# Patient Record
Sex: Male | Born: 1985 | Race: White | Hispanic: No | Marital: Single | Smoking: Current every day smoker
Health system: Southern US, Community
[De-identification: ages and names within clinical notes are randomized; demographics above are authoritative.]

## PROBLEM LIST (undated history)

## (undated) DIAGNOSIS — Z789 Other specified health status: Secondary | ICD-10-CM

## (undated) DIAGNOSIS — B1920 Unspecified viral hepatitis C without hepatic coma: Secondary | ICD-10-CM

---
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
## 2006-03-20 ENCOUNTER — Emergency Department (HOSPITAL_COMMUNITY): Admission: EM | Admit: 2006-03-20 | Discharge: 2006-03-20 | Payer: Self-pay | Admitting: Emergency Medicine

## 2008-02-09 ENCOUNTER — Encounter: Payer: Self-pay | Admitting: Orthopedic Surgery

## 2008-02-10 ENCOUNTER — Observation Stay (HOSPITAL_COMMUNITY): Admission: EM | Admit: 2008-02-10 | Discharge: 2008-02-11 | Payer: Self-pay | Admitting: Emergency Medicine

## 2008-02-10 ENCOUNTER — Ambulatory Visit: Payer: Self-pay | Admitting: Orthopedic Surgery

## 2008-02-12 ENCOUNTER — Emergency Department (HOSPITAL_COMMUNITY): Admission: EM | Admit: 2008-02-12 | Discharge: 2008-02-13 | Payer: Self-pay | Admitting: Emergency Medicine

## 2008-02-13 ENCOUNTER — Ambulatory Visit (HOSPITAL_COMMUNITY): Admission: RE | Admit: 2008-02-13 | Discharge: 2008-02-13 | Payer: Self-pay | Admitting: Emergency Medicine

## 2008-02-21 ENCOUNTER — Ambulatory Visit: Payer: Self-pay | Admitting: Orthopedic Surgery

## 2008-02-21 DIAGNOSIS — L02419 Cutaneous abscess of limb, unspecified: Secondary | ICD-10-CM | POA: Insufficient documentation

## 2008-02-21 DIAGNOSIS — L03119 Cellulitis of unspecified part of limb: Secondary | ICD-10-CM

## 2008-03-06 ENCOUNTER — Ambulatory Visit: Payer: Self-pay | Admitting: Orthopedic Surgery

## 2008-06-25 ENCOUNTER — Emergency Department (HOSPITAL_COMMUNITY): Admission: EM | Admit: 2008-06-25 | Discharge: 2008-06-25 | Payer: Self-pay | Admitting: Emergency Medicine

## 2009-04-27 ENCOUNTER — Emergency Department (HOSPITAL_COMMUNITY): Admission: EM | Admit: 2009-04-27 | Discharge: 2009-04-27 | Payer: Self-pay | Admitting: Emergency Medicine

## 2009-11-03 ENCOUNTER — Emergency Department: Payer: Self-pay | Admitting: Emergency Medicine
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
## 2010-09-24 NOTE — Group Therapy Note (Signed)
Ricky Fleming, Ricky Fleming               ACCOUNT NO.:  [id number]   MEDICAL RECORD NO.:  [id number]          PATIENT TYPE:  OBV   LOCATION:  A335                          FACILITY:  [facility]   PHYSICIAN:  Vickki Hearing, M.D.DATE OF BIRTH:  Oct 28, 1985   DATE OF PROCEDURE:  DATE OF DISCHARGE:  02/11/2008                                 PROGRESS NOTE   The patient was re-evaluated after 24 hours of antibiotics and found to  have decrease in cellulitis, swelling, tenderness, and redness.      Vickki Hearing, M.D.  Electronically Signed     SEH/MEDQ  D:  02/22/2008  T:  02/22/2008  Job:  [id number]

## 2010-09-24 NOTE — Discharge Summary (Signed)
NAMEJERMINE, Ricky Fleming               ACCOUNT NO.:  [id number]   MEDICAL RECORD NO.:  [id number]          PATIENT TYPE:  INP   LOCATION:  A335                          FACILITY:  [facility]   PHYSICIAN:  Skeet Latch, DO    DATE OF BIRTH:  1986-01-10   DATE OF ADMISSION:  02/09/2008  DATE OF DISCHARGE:  10/02/2009LH                               DISCHARGE SUMMARY   DISCHARGE DIAGNOSES:  1. Left-knee cellulitis.  2. Hypokalemia.   BRIEF HOSPITAL COURSE:  This is a 25 year old Caucasian male who  presented to [facility], complaining of left-knee pain.  Patient states that he noticed a pimple on his left knee, it ruptured  and at work he noticed he had extreme pain in the knee.  He saw it  draining, as well as draining with pus and blood.  At that time, his  knee was erythematous.  Patient decided to come to the emergency room to  be evaluated.   Patient's initial workup included a left-knee x-ray that showed no acute  osseous abnormalities, a little, if any, knee effusion.  Patient  underwent an MRI of his left knee that showed findings compatible with  cellulitis anteriorly in the patient proximal to middle of the leg and  proximal extension anterior to the patellar tendon.  There was no focal  fluid collection demonstrated to suggest the presence of an abscess.  There is proximal mild proximal extensor myositis.  No demonstrated  intra-articular abnormalities, aside from a small Baker cyst.  No  evidence of osteomyelitis.   Patient was placed on IV antibiotics.  He was continued on IV  antibiotics during his hospital stay.  Patient's pain seemed to be  moderate to severe when palpated during his hospital stay.  Patient  seemed stable during his stay.  Orthopedic surgery was consulted  regarding any surgical intervention.  No surgical intervention was  recommended.  It was just recommended that patient be continued on  antibiotics.  At this time, patient will be discharged  with close  followup with orthopedic surgery in approximately one week.   DISCHARGE MEDICATIONS INCLUDED:  1. Oxycodone 10 mg every 4 hours as needed.  2. Medrol Dosepak as directed.  3. Doxycycline 100 mg twice a day for 14 days.   VITALS ON DISCHARGE:  Temperature is 99.8, pulse 103, respirations 20,  blood pressure 105/70.   DISCHARGE LABORATORY VALUES:  Sodium is 137, potassium 3.3, chloride  104, CO2 is 25, glucose 94, BUN 4, creatinine 0.66.  White count is  10.6, hemoglobin 13.4, hematocrit 38.7, platelet count is 197.  He had  one blood culture that showed Gram-negative rods.  This is preliminary.  There is no final blood culture result at this time.   CONDITION ON DISCHARGE:  Stable.   DISPOSITION:  Patient will be discharged to home.   DISCHARGE INSTRUCTIONS:  Patient is not to go back to work for  approximately one week until seen by orthopedic surgery.  Patient's diet  is with no restrictions.  Patient is to keep his left leg clean, dry.  Patient is to increase  his activity slowly.  Patient is to take all  medications as directed.  Patient is to apply a heating pad to his left  knee three times daily until symptoms have resolved.  Patient also can  take Tylenol every 4 hours as needed for pain or fever.  Patient is to  follow up with Dr. Romeo Apple in approximately one week.  His office  number and address will be given to the patient prior to discharge.  Patient is to return to the emergency room if he has any more severe  left-knee pain or increased swelling.  I believe patient understands all  these instructions at this time.      Skeet Latch, DO  Electronically Signed     SM/MEDQ  D:  02/11/2008  T:  02/11/2008  Job:  [id number]   cc:   Vickki Hearing, M.D.  Fax: [phone]

## 2010-09-24 NOTE — H&P (Signed)
Ricky Fleming, Ricky Fleming               ACCOUNT NO.:  [id number]   MEDICAL RECORD NO.:  [id number]          PATIENT TYPE:  OBV   LOCATION:  A335                          FACILITY:  [facility]   PHYSICIAN:  Dorris Singh, DO    DATE OF BIRTH:  07-30-1985   DATE OF ADMISSION:  02/09/2008  DATE OF DISCHARGE:  LH                              HISTORY & PHYSICAL   HISTORY OF PRESENT ILLNESS:  The patient is a 25 year old Caucasian male  who presented to [facility] with left knee pain.  The patient states  that he had noticed a pimple on his left knee and he ruptured it, and  while he was at work he noticed that he got extreme pain in his knee,  looked down and saw pus and blood oozing out of his pants.  At that  point in time, he looked at his knee and it was red.  The patient denies  any fever or chills, but is positive only for pain.   PAST MEDICAL HISTORY:  He he has none.   ALLERGIES:  He has no known allergies to any medication.   SOCIAL HISTORY:  The patient is a smoker and occasional drinker.  Does  admit to a former drug abuse.   MEDICATIONS:  None.   REVIEW OF SYSTEMS:  CONSTITUTIONAL:  Negative for weakness or appetite  changes.  HEART:  Negative for palpitations or chest pain.  RESPIRATORY:  Negative for shortness of breath.  ABDOMEN:  Negative for nausea,  vomiting, fever, chills.  GU:  Negative for dysuria or hesitancy.  MUSCULOSKELETAL:  Positive for knee pain.  NEUROLOGICAL:  Negative for  dizziness or changes of consciousness or altered mental status.  SKIN:  Positive for redness and swelling of left knee.   PHYSICAL EXAMINATION:  VITAL SIGNS:  Stable.  GENERAL:  The patient is a 25 year old Caucasian male who is well-  developed, well-nourished in no acute distress.  HEART:  Regular rate and rhythm.  LUNGS:  Clear to auscultation bilaterally.  ABDOMEN:  Soft, nontender, nondistended.  EXTREMITIES:  Left knee positive redness and swelling with a rupture to  the I&D site  from the patient rupturing it the night before.  Positive  pulses.   LABORATORY DATA:  His labs for today are as follows:  White count of  14.9, hemoglobin 14.4, hematocrit 41, platelet count 246.  Blood  cultures currently are negative.  He had a radiology exam done for  complete view of the knee which showed no acute osseous abnormalities,  little if any knee effusion.   ASSESSMENT/PLAN:  1. Left knee cellulitis.  2. Tobacco abuse.   PLAN:  Admit the patient to the service of [facility] to [facility].  Will  have them make him total bedrest with elevation of the knee with cold  compresses p.r.n., give him a regular diet, give him some IV fluids as  well.  Check his labs in the morning.  CBC, B-met.  Put him on DVT and  GI prophylaxis along with antiemetics and also give him some pain  medication.  Will put  on vancomycin with pharmacy to dose and contact  precautions will also educate him on smoking cessation.  Will determine  how he develops over the next 24 hours to see if we may need a PICC line  placement for him, and depending, will also consider case management to  see him for possible IV home therapy as needed.  Will continue to  monitor the patient and change therapy when appropriate.      Dorris Singh, DO  Electronically Signed     CB/MEDQ  D:  02/10/2008  T:  02/10/2008  Job:  [id number]

## 2011-02-10 LAB — CBC
HCT: 41
MCHC: 35.1
MCV: 88.6
Platelets: 246
RBC: 4.62
RDW: 13.1
WBC: 14.9 — ABNORMAL HIGH

## 2011-02-10 LAB — DIFFERENTIAL
Basophils Relative: 0
Eosinophils Absolute: 0
Lymphs Abs: 2.6
Neutro Abs: 11.3 — ABNORMAL HIGH

## 2011-02-10 LAB — CULTURE, BLOOD (ROUTINE X 2)
Culture: NO GROWTH
Report Status: 10052009

## 2011-02-11 LAB — DIFFERENTIAL
Basophils Absolute: 0
Basophils Relative: 0
Eosinophils Absolute: 0.1
Eosinophils Absolute: 0.1
Eosinophils Relative: 1
Lymphocytes Relative: 19
Lymphocytes Relative: 22
Monocytes Absolute: 1
Monocytes Absolute: 1.1 — ABNORMAL HIGH
Monocytes Relative: 9
Neutro Abs: 7.5
Neutro Abs: 7.7

## 2011-02-11 LAB — CBC
HCT: 38.7 — ABNORMAL LOW
HCT: 39
Hemoglobin: 13.4
Hemoglobin: 13.6
MCV: 89.4
MCV: 90.5
Platelets: 197
Platelets: 204
RBC: 4.36
WBC: 10.6 — ABNORMAL HIGH
WBC: 11.3 — ABNORMAL HIGH

## 2011-02-11 LAB — BASIC METABOLIC PANEL
BUN: 4 — ABNORMAL LOW
Calcium: 8.9
Chloride: 104
GFR calc Af Amer: >60
Potassium: 3.2 — ABNORMAL LOW
Sodium: 137

## 2011-02-11 LAB — PROTIME-INR: INR: 1.1

## 2011-03-09 ENCOUNTER — Emergency Department (HOSPITAL_COMMUNITY)
Admission: EM | Admit: 2011-03-09 | Discharge: 2011-03-09 | Disposition: A | Discharge status: Home / Self Care | Payer: Self-pay | Attending: Emergency Medicine | Admitting: Emergency Medicine

## 2011-03-09 ENCOUNTER — Emergency Department (HOSPITAL_COMMUNITY): Payer: Self-pay

## 2011-03-09 DIAGNOSIS — S62309A Unspecified fracture of unspecified metacarpal bone, initial encounter for closed fracture: Secondary | ICD-10-CM | POA: Insufficient documentation

## 2011-03-09 DIAGNOSIS — F17200 Nicotine dependence, unspecified, uncomplicated: Secondary | ICD-10-CM | POA: Insufficient documentation

## 2011-03-09 DIAGNOSIS — IMO0002 Reserved for concepts with insufficient information to code with codable children: Secondary | ICD-10-CM | POA: Insufficient documentation

## 2011-03-09 MED ORDER — HYDROCODONE-ACETAMINOPHEN 5-325 MG PO TABS: 1.0000 | ORAL_TABLET | ORAL | Status: AC | PRN

## 2011-03-09 NOTE — ED Provider Notes (Signed)
History     CSN: [id number] Arrival date & time: 03/09/2011  5:22 PM   First MD Initiated Contact with Patient 03/09/11 1718      Chief Complaint  Patient presents with  . Wrist Injury    (Consider location/radiation/quality/duration/timing/severity/associated sxs/prior treatment) Patient is a 25 y.o. male presenting with wrist injury. The history is provided by the patient.  Wrist Injury  The incident occurred yesterday. The incident occurred at home. The injury mechanism was a direct blow (He was hit directly on his right dorsal wrist by a sledge hammer yesterday while working on his tractor). The pain is present in the right wrist. The quality of the pain is described as sharp and throbbing. The pain is at a severity of 4/10. The pain is moderate. The pain has been constant since the incident. Pertinent negatives include no fever. He reports no foreign bodies present. The symptoms are aggravated by movement, palpation and use. He has tried nothing for the symptoms.    History reviewed. No pertinent past medical history.  History reviewed. No pertinent past surgical history.  History reviewed. No pertinent family history.  History  Substance Use Topics  . Smoking status: Current Everyday Smoker -- 2.0 packs/day  . Smokeless tobacco: Not on file  . Alcohol Use: No      Review of Systems  Constitutional: Negative for fever.  HENT: Negative.  Negative for sore throat.   Eyes: Negative.   Respiratory: Negative.   Cardiovascular: Negative.   Gastrointestinal: Negative.   Genitourinary: Negative.   Musculoskeletal: Positive for arthralgias. Negative for joint swelling.  Skin: Negative.  Negative for rash and wound.  Neurological: Negative for weakness and numbness.  Hematological: Negative.   Psychiatric/Behavioral: Negative.     Allergies  Review of patient's allergies indicates no known allergies.  Home Medications  No current outpatient prescriptions on  file.  BP 113/50  Pulse 60  Temp(Src) 97.6 F (36.4 C) (Oral)  Resp 16  Ht 6\' 4"  (1.93 m)  Wt 170 lb (77.111 kg)  BMI 20.69 kg/m2  SpO2 100%  Physical Exam  Nursing note and vitals reviewed. Constitutional: He is oriented to person, place, and time. He appears well-developed and well-nourished.  HENT:  Head: Normocephalic.  Eyes: Conjunctivae are normal.  Neck: Normal range of motion.  Cardiovascular: Normal rate and intact distal pulses.  Exam reveals no decreased pulses.   Pulses:      Dorsalis pedis pulses are 2+ on the right side, and 2+ on the left side.       Posterior tibial pulses are 2+ on the right side, and 2+ on the left side.  Pulmonary/Chest: Effort normal.  Musculoskeletal: He exhibits edema and tenderness.       Right wrist: He exhibits decreased range of motion, tenderness and bony tenderness. He exhibits no swelling and no deformity.       Pain to palpation along base of 2nd through 4th metacarpal bones.  No deformity noted.  Distal sensation intact.  Less than 3 sec cap refill.  Neurological: He is alert and oriented to person, place, and time. No sensory deficit.  Skin: Skin is warm, dry and intact.    ED Course  Procedures (including critical care time)  Labs Reviewed - No data to display Dg Wrist Complete Right  03/09/2011  *RADIOLOGY REPORT*  Clinical Data: Injury  RIGHT WRIST - COMPLETE 3+ VIEW  Comparison: None.  Findings: No fracture or dislocation is seen in the wrist.  Possible irregularity  of the radial base of the fourth metacarpal.  The joint spaces are preserved.  The visualized soft tissues are unremarkable.  IMPRESSION: No fracture or dislocation is seen in the rest.  Possible irregularity/fracture of the radial base of the fourth metacarpal.  Original Report Authenticated By: Charline Bills, M.D.   Dg Hand Complete Right  03/09/2011  *RADIOLOGY REPORT*  Clinical Data: Hip hand with sledgehammer, pain to second through fifth metacarpals   RIGHT HAND - COMPLETE 3+ VIEW  Comparison: 06/25/2008  Findings: On the lateral view, there is a suspected cortical irregularity/fracture involving the base of one of the metacarpals, possibly the 4th.  However, no corresponding abnormality is seen on the additional views.  Possible mild dorsal soft tissue swelling.  IMPRESSION: Possible cortical irregularity/fracture involving the base of one of the metacarpals on the lateral view, possibly the 4th, although this cannot be localized on the additional views.  Correlate with the site of patient's maximal tenderness.  Original Report Authenticated By: Charline Bills, M.D.     No diagnosis found.    MDM  Probable fracture of 4th proximal metacarpal.  Wrist splint,  Sling applied by RN.  Examined post application,  Distal sensation intact,         Candis Musa, [place] 03/09/11 1744
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
## 2011-03-10 NOTE — ED Provider Notes (Signed)
Medical screening examination/treatment/procedure(s) were performed by non-physician practitioner and as supervising physician I was immediately available for consultation/collaboration.    Gwyneth Sprout, MD 03/10/11 0021

## 2012-05-01 ENCOUNTER — Emergency Department (HOSPITAL_COMMUNITY)
Admission: EM | Admit: 2012-05-01 | Discharge: 2012-05-01 | Disposition: A | Discharge status: Home / Self Care | Payer: No Typology Code available for payment source | Attending: Emergency Medicine | Admitting: Emergency Medicine

## 2012-05-01 ENCOUNTER — Emergency Department (HOSPITAL_COMMUNITY): Payer: No Typology Code available for payment source

## 2012-05-01 ENCOUNTER — Encounter (HOSPITAL_COMMUNITY): Payer: Self-pay | Admitting: *Deleted

## 2012-05-01 DIAGNOSIS — R0789 Other chest pain: Secondary | ICD-10-CM

## 2012-05-01 DIAGNOSIS — J069 Acute upper respiratory infection, unspecified: Secondary | ICD-10-CM | POA: Insufficient documentation

## 2012-05-01 DIAGNOSIS — Y939 Activity, unspecified: Secondary | ICD-10-CM | POA: Insufficient documentation

## 2012-05-01 DIAGNOSIS — F17200 Nicotine dependence, unspecified, uncomplicated: Secondary | ICD-10-CM | POA: Insufficient documentation

## 2012-05-01 DIAGNOSIS — Y92410 Unspecified street and highway as the place of occurrence of the external cause: Secondary | ICD-10-CM | POA: Insufficient documentation

## 2012-05-01 DIAGNOSIS — S298XXA Other specified injuries of thorax, initial encounter: Secondary | ICD-10-CM | POA: Insufficient documentation

## 2012-05-01 MED ORDER — IBUPROFEN 800 MG PO TABS
800.0000 mg | ORAL_TABLET | Freq: Once | ORAL | Status: AC
  Administered 2012-05-01: 800 mg via ORAL
  Filled 2012-05-01: qty 1

## 2012-05-01 MED ORDER — BACLOFEN 10 MG PO TABS: 10.0000 mg | ORAL_TABLET | Freq: Three times a day (TID) | ORAL | Status: AC

## 2012-05-01 MED ORDER — DIAZEPAM 5 MG PO TABS
5.0000 mg | ORAL_TABLET | Freq: Once | ORAL | Status: AC
  Administered 2012-05-01: 5 mg via ORAL
  Filled 2012-05-01: qty 1

## 2012-05-01 MED ORDER — PSEUDOEPHEDRINE HCL 60 MG PO TABS
60.0000 mg | ORAL_TABLET | Freq: Once | ORAL | Status: AC
Start: 2012-05-01 — End: 2012-05-01
  Administered 2012-05-01: 60 mg via ORAL
  Filled 2012-05-01: qty 1

## 2012-05-01 MED ORDER — PSEUDOEPHEDRINE HCL 60 MG PO TABS: ORAL_TABLET | ORAL | Status: DC

## 2012-05-01 NOTE — ED Notes (Signed)
Pt escorted to Radiology.
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
## 2012-05-02 NOTE — ED Provider Notes (Signed)
Medical screening examination/treatment/procedure(s) were performed by non-physician practitioner and as supervising physician I was immediately available forconsultation/collaboration.   Jahnya Trindade, MD 05/02/12 1049 

## 2012-11-27 ENCOUNTER — Emergency Department (HOSPITAL_COMMUNITY)
Admission: EM | Admit: 2012-11-27 | Discharge: 2012-11-27 | Disposition: A | Discharge status: Home / Self Care | Payer: Self-pay | Attending: Emergency Medicine | Admitting: Emergency Medicine

## 2012-11-27 ENCOUNTER — Encounter (HOSPITAL_COMMUNITY): Payer: Self-pay | Admitting: *Deleted

## 2012-11-27 ENCOUNTER — Emergency Department (HOSPITAL_COMMUNITY): Payer: Self-pay

## 2012-11-27 DIAGNOSIS — F17200 Nicotine dependence, unspecified, uncomplicated: Secondary | ICD-10-CM | POA: Insufficient documentation

## 2012-11-27 DIAGNOSIS — Y9289 Other specified places as the place of occurrence of the external cause: Secondary | ICD-10-CM | POA: Insufficient documentation

## 2012-11-27 DIAGNOSIS — R209 Unspecified disturbances of skin sensation: Secondary | ICD-10-CM | POA: Insufficient documentation

## 2012-11-27 DIAGNOSIS — S62300A Unspecified fracture of second metacarpal bone, right hand, initial encounter for closed fracture: Secondary | ICD-10-CM

## 2012-11-27 DIAGNOSIS — W208XXA Other cause of strike by thrown, projected or falling object, initial encounter: Secondary | ICD-10-CM | POA: Insufficient documentation

## 2012-11-27 DIAGNOSIS — S62309A Unspecified fracture of unspecified metacarpal bone, initial encounter for closed fracture: Secondary | ICD-10-CM | POA: Insufficient documentation

## 2012-11-27 DIAGNOSIS — Y9389 Activity, other specified: Secondary | ICD-10-CM | POA: Insufficient documentation

## 2012-11-27 MED ORDER — IBUPROFEN 800 MG PO TABS: 800.0000 mg | ORAL_TABLET | Freq: Three times a day (TID) | ORAL | Status: DC

## 2012-11-27 MED ORDER — OXYCODONE-ACETAMINOPHEN 5-325 MG PO TABS
1.0000 | ORAL_TABLET | Freq: Once | ORAL | Status: AC
  Administered 2012-11-27: 1 via ORAL
  Filled 2012-11-27: qty 1

## 2012-11-27 MED ORDER — IBUPROFEN 800 MG PO TABS
800.0000 mg | ORAL_TABLET | Freq: Once | ORAL | Status: AC
  Administered 2012-11-27: 800 mg via ORAL
  Filled 2012-11-27: qty 1

## 2012-11-27 MED ORDER — OXYCODONE-ACETAMINOPHEN 7.5-325 MG PO TABS: 1.0000 | ORAL_TABLET | ORAL | Status: DC | PRN

## 2012-11-27 NOTE — ED Notes (Signed)
Pt was working today when a heavy piece of equipment ?400lbs fell on right hand area, pt has swelling, bruising to right hand, happened this am,

## 2012-11-27 NOTE — ED Provider Notes (Signed)
History    CSN: [id number] Arrival date & time 11/27/12  1442  First MD Initiated Contact with Patient 11/27/12 1548     Chief Complaint  Patient presents with  . Hand Injury   (Consider location/radiation/quality/duration/timing/severity/associated sxs/prior Treatment) Patient is a 27 y.o. male presenting with hand injury. The history is provided by the patient.  Hand Injury Location:  Hand Time since incident:  6 hours Injury: yes   Mechanism of injury: crush   Crush injury:    Mechanism:  Farm machinery   Approximate weight of object:  400 pounds Hand location:  R hand Pain details:    Quality:  Pressure, shooting, tingling and throbbing   Radiates to:  Does not radiate   Severity:  Severe   Onset quality:  Sudden   Duration:  6 hours   Timing:  Constant   Progression:  Worsening Chronicity:  New Handedness:  Right-handed Dislocation: no   Foreign body present:  No foreign bodies Prior injury to area:  No Relieved by:  Nothing Worsened by:  Movement Ineffective treatments:  NSAIDs Associated symptoms: no fever and no neck pain    Ricky Fleming is a 27 y.o. male who presents to the ED with a crush injury to the right hand. He states that he was working on a tractor and a bar fell on his hand. The injury happened this morning and he has been resting the area, using ice and taking ibuprofen without relief. The hand is very painful and swollen.   History reviewed. No pertinent past medical history. History reviewed. No pertinent past surgical history. No family history on file. History  Substance Use Topics  . Smoking status: Current Every Day Smoker -- 2.00 packs/day    Types: Cigarettes  . Smokeless tobacco: Not on file  . Alcohol Use: No    Review of Systems  Constitutional: Negative for fever and chills.  HENT: Negative for neck pain.   Respiratory: Negative for shortness of breath.   Gastrointestinal: Negative for nausea and vomiting.    Musculoskeletal:       Right hand pain and swelling   Skin: Negative for wound.  Neurological: Negative for headaches.  Psychiatric/Behavioral: The patient is not nervous/anxious.     Allergies  Review of patient's allergies indicates no known allergies.  Home Medications   Current Outpatient Rx  Name  Route  Sig  Dispense  Refill  . ibuprofen (ADVIL,MOTRIN) 200 MG tablet   Oral   Take 400 mg by mouth every 6 (six) hours as needed. For pain         . pseudoephedrine (SUDAFED) 60 MG tablet      1 po tid for congestion   30 tablet   0    BP 100/57  Pulse 76  Temp(Src) 98 F (36.7 C) (Oral)  Resp 20  Ht 6\' 4"  (1.93 m)  Wt 170 lb (77.111 kg)  BMI 20.7 kg/m2  SpO2 99% Physical Exam  Nursing note and vitals reviewed. Constitutional: He is oriented to person, place, and time. He appears well-developed and well-nourished. No distress.  HENT:  Head: Normocephalic.  Eyes: EOM are normal.  Neck: Normal range of motion. Neck supple.  Cardiovascular: Normal rate.   Pulmonary/Chest: Effort normal.  Musculoskeletal:       Right hand: He exhibits decreased range of motion, tenderness, bony tenderness, deformity and swelling. He exhibits normal capillary refill and no laceration. Decreased sensation (minimal) noted. Decreased sensation is present in the radial  distribution. Decreased strength (due to pain) noted.       Hands: Pain, swelling, ecchymosis of dorsal aspect of the right hand over the second distal MC.   Neurological: He is alert and oriented to person, place, and time. No cranial nerve deficit.  Adequate circulation  Skin: Skin is warm and dry.  Psychiatric: He has a normal mood and affect. His behavior is normal.    ED Course  Procedures (including critical care time) Labs Reviewed - No data to display Dg Hand Complete Right  11/27/2012   *RADIOLOGY REPORT*  Clinical Data: Status post crush injury.  Right hand pain.  RIGHT HAND - COMPLETE 3+ VIEW  Comparison:  Plain films right hand 03/09/2011.  Findings: The patient has a fracture of the distal second metacarpal.  The metacarpal head is divided into two fragments. The fracture has an oblique component originating in the ulnar side metaphysis and extending through the radial aspect of the metacarpal head.  There is also a transverse component through the distal neck.  The fracture is mildly impacted.  No other acute bony or joint abnormality is identified.  IMPRESSION: Distal second metacarpal fracture as described.   Original Report Authenticated By: Holley Dexter, M.D.   MDM: Dr. Juleen China in to examine the patient.   27 y.o. male with a closed fracture of the second MC right hand. The fracture is through the radial aspect of the [facility] head and a transverse component through the distal neck.  Will place in radial gutter splint, ice, elevation and follow up with ortho. Discussed in detail with the patient x-ray and clinical findings and plan of care. Discussed signs and symptoms of compartment syndrome and if any appear need for immediate return to the ED. Patient voices understanding.  I have reviewed this patient's vital signs, nurses notes, appropriate imaging.  Patient remains neurovascular intact s/p splint application and is stable for discharge home.    Medication List    TAKE these medications       oxyCODONE-acetaminophen 7.5-325 MG per tablet  Commonly known as:  PERCOCET  Take 1 tablet by mouth every 4 (four) hours as needed for pain.      ASK your doctor about these medications       ibuprofen 200 MG tablet  Commonly known as:  ADVIL,MOTRIN  Take 400 mg by mouth every 6 (six) hours as needed. For pain  Ask about: Which instructions should I use?     ibuprofen 800 MG tablet  Commonly known as:  ADVIL,MOTRIN  Take 1 tablet (800 mg total) by mouth 3 (three) times daily.  Ask about: Which instructions should I use?         [place], [place] 11/27/12 [phone]

## 2012-12-02 ENCOUNTER — Emergency Department (HOSPITAL_COMMUNITY): Payer: Self-pay

## 2012-12-02 ENCOUNTER — Ambulatory Visit: Payer: Self-pay | Admitting: Orthopedic Surgery

## 2012-12-02 ENCOUNTER — Encounter (HOSPITAL_COMMUNITY): Payer: Self-pay | Admitting: *Deleted

## 2012-12-02 ENCOUNTER — Emergency Department (HOSPITAL_COMMUNITY)
Admission: EM | Admit: 2012-12-02 | Discharge: 2012-12-02 | Disposition: A | Discharge status: Home / Self Care | Payer: Self-pay | Attending: Emergency Medicine | Admitting: Emergency Medicine

## 2012-12-02 DIAGNOSIS — Y929 Unspecified place or not applicable: Secondary | ICD-10-CM | POA: Insufficient documentation

## 2012-12-02 DIAGNOSIS — S62309A Unspecified fracture of unspecified metacarpal bone, initial encounter for closed fracture: Secondary | ICD-10-CM | POA: Insufficient documentation

## 2012-12-02 DIAGNOSIS — W19XXXA Unspecified fall, initial encounter: Secondary | ICD-10-CM | POA: Insufficient documentation

## 2012-12-02 DIAGNOSIS — S62309D Unspecified fracture of unspecified metacarpal bone, subsequent encounter for fracture with routine healing: Secondary | ICD-10-CM

## 2012-12-02 DIAGNOSIS — Y939 Activity, unspecified: Secondary | ICD-10-CM | POA: Insufficient documentation

## 2012-12-02 DIAGNOSIS — F17200 Nicotine dependence, unspecified, uncomplicated: Secondary | ICD-10-CM | POA: Insufficient documentation

## 2012-12-02 MED ORDER — OXYCODONE-ACETAMINOPHEN 5-325 MG PO TABS
2.0000 | ORAL_TABLET | Freq: Once | ORAL | Status: AC
  Administered 2012-12-02: 2 via ORAL
  Filled 2012-12-02: qty 2

## 2012-12-02 NOTE — ED Notes (Signed)
Injury to right arm x 5 days ago.  Seen here and given temp splint.  States fell 3 days ago, landing on same arm.  Unsure if re-injured or not.  C/o pain.  Unable to see Ortho due to financial reasons.

## 2012-12-02 NOTE — ED Provider Notes (Signed)
Medical screening examination/treatment/procedure(s) were conducted as a shared visit with non-physician practitioner(s) and myself.  I personally evaluated the patient during the encounter.  26yM with crush injury to hand. Closed fx as below.  Diffuse swelling. Not tense, able to range digits albeit with significant pain. Sensation intact to light touch. Brisk cap refill in finger tips. Doubt compartment syndrome. Splint. Pain meds. Hand FU.    Dg Hand Complete Right  11/27/2012   *RADIOLOGY REPORT*  Clinical Data: Status post crush injury.  Right hand pain.  RIGHT HAND - COMPLETE 3+ VIEW  Comparison: Plain films right hand 03/09/2011.  Findings: The patient has a fracture of the distal second metacarpal.  The metacarpal head is divided into two fragments. The fracture has an oblique component originating in the ulnar side metaphysis and extending through the radial aspect of the metacarpal head.  There is also a transverse component through the distal neck.  The fracture is mildly impacted.  No other acute bony or joint abnormality is identified.  IMPRESSION: Distal second metacarpal fracture as described.   Original Report Authenticated By: Holley Dexter, M.D.   Raeford Razor, MD 12/02/12 2234

## 2012-12-02 NOTE — ED Provider Notes (Signed)
CSN: [id number]     Arrival date & time 12/02/12  1745 History     First MD Initiated Contact with Patient 12/02/12 1853     Chief Complaint  Patient presents with  . Arm Pain   (Consider location/radiation/quality/duration/timing/severity/associated sxs/prior Treatment) HPI Comments: Patient was seen here 5 days ago after and crush injury that resulted in a second metacarpal fx of the right hand.  patient states that he fell 3 days ago and reports worsening pain to his hand .  Has been wearing the splint and taking percocet w/o relief.  States that he was referred to see Dr. Romeo Apple but has not seen him because he doesn't have the money for an appt.    Patient is a 27 y.o. male presenting with arm injury. The history is provided by the patient.  Arm Injury Location:  Hand Time since incident:  5 days Injury: yes   Mechanism of injury comment:  Crush injury Hand location:  R hand Pain details:    Quality:  Aching and throbbing   Radiates to:  Does not radiate   Severity:  Mild   Onset quality:  Sudden   Timing:  Constant   Progression:  Worsening Chronicity:  New Handedness:  Right-handed Dislocation: no   Foreign body present:  No foreign bodies Prior injury to area:  Yes Relieved by:  Nothing Worsened by:  Movement Ineffective treatments:  Narcotics Associated symptoms: decreased range of motion   Associated symptoms: no fever, no muscle weakness, no numbness, no stiffness, no swelling and no tingling     History reviewed. No pertinent past medical history. History reviewed. No pertinent past surgical history. No family history on file. History  Substance Use Topics  . Smoking status: Current Every Day Smoker -- 2.00 packs/day    Types: Cigarettes  . Smokeless tobacco: Not on file  . Alcohol Use: No    Review of Systems  Constitutional: Negative for fever and chills.  Genitourinary: Negative for dysuria and difficulty urinating.  Musculoskeletal: Positive for  joint swelling and arthralgias. Negative for stiffness.  Skin: Negative for color change and wound.  All other systems reviewed and are negative.    Allergies  Review of patient's allergies indicates no known allergies.  Home Medications   Current Outpatient Rx  Name  Route  Sig  Dispense  Refill  . ibuprofen (ADVIL,MOTRIN) 800 MG tablet   Oral   Take 1 tablet (800 mg total) by mouth 3 (three) times daily.   21 tablet   0    BP 93/76  Pulse 84  Temp(Src) 98.7 F (37.1 C) (Oral)  Resp 18  SpO2 99% Physical Exam  Nursing note and vitals reviewed. Constitutional: He is oriented to person, place, and time. He appears well-developed and well-nourished. No distress.  HENT:  Head: Normocephalic and atraumatic.  Cardiovascular: Normal rate, regular rhythm, normal heart sounds and intact distal pulses.   No murmur heard. Pulmonary/Chest: Effort normal and breath sounds normal. No respiratory distress.  Musculoskeletal: He exhibits tenderness. He exhibits no edema.  ttp of the dorsal right hand mild STS present.  Radial pulse is brisk, distal sensation intact.  CR< 2 sec.  No discoloration of the fingers.  No bruising.  Compartments are soft.  Neurological: He is alert and oriented to person, place, and time. He exhibits normal muscle tone. Coordination normal.  Skin: Skin is warm and dry.    ED Course   Procedures (including critical care time)  Labs Reviewed -  No data to display Dg Hand Complete Right  12/02/2012   *RADIOLOGY REPORT*  Clinical Data: Pain post trauma  RIGHT HAND - COMPLETE 3+ VIEW  Comparison: November 27, 2012  Findings: Frontal, oblique, and lateral views were obtained with overlying plaster.  There is a comminuted fracture of the distal second metacarpal with medial displacement of the distal major fracture fragment with respect proximal fragment.  A second fracture fragment is displaced slightly laterally.  The alignment is essentially stable compared to recent  prior study.  No new fracture.  No dislocation.  Joint spaces appear intact.  IMPRESSION: Comminuted fracture distal second metacarpal with alignment essentially unchanged compared to recent prior study.  No new fracture.  No dislocation.   Original Report Authenticated By: Bretta Bang, M.D.     MDM     ED chart was reviewed by me  Comparison of x-rays from 11/27/2012 do not show any new fracture. Patient is wearing splint. No  distal discoloration of the fingers, distal sensation is intact. Patient had an appointment scheduled with Dr. Romeo Apple for today but did not keep his appointment do to lack of funds for the office visit and states he has rescheduled his appointment for next week. Advised to continue his previous prescriptions, ice , elevate the hand.   Davian Hanshaw L. Roniqua Kintz, PA-C 12/04/12 1529

## 2012-12-06 ENCOUNTER — Ambulatory Visit: Payer: Self-pay | Admitting: Orthopedic Surgery

## 2012-12-07 NOTE — ED Provider Notes (Signed)
Medical screening examination/treatment/procedure(s) were performed by non-physician practitioner and as supervising physician I was immediately available for consultation/collaboration.   Carleene Cooper III, MD 12/07/12 [phone]

## 2014-08-08 ENCOUNTER — Emergency Department (HOSPITAL_COMMUNITY): Payer: Self-pay

## 2014-08-08 ENCOUNTER — Encounter (HOSPITAL_COMMUNITY): Payer: Self-pay | Admitting: *Deleted

## 2014-08-08 ENCOUNTER — Inpatient Hospital Stay (HOSPITAL_COMMUNITY)
Admission: EM | Admit: 2014-08-08 | Discharge: 2014-08-11 | DRG: 443 | Disposition: A | Discharge status: Home / Self Care | Payer: Self-pay | Attending: Internal Medicine | Admitting: Internal Medicine

## 2014-08-08 DIAGNOSIS — R1032 Left lower quadrant pain: Secondary | ICD-10-CM

## 2014-08-08 DIAGNOSIS — L84 Corns and callosities: Secondary | ICD-10-CM | POA: Diagnosis present

## 2014-08-08 DIAGNOSIS — S6291XA Unspecified fracture of right wrist and hand, initial encounter for closed fracture: Secondary | ICD-10-CM | POA: Diagnosis present

## 2014-08-08 DIAGNOSIS — R001 Bradycardia, unspecified: Secondary | ICD-10-CM | POA: Diagnosis present

## 2014-08-08 DIAGNOSIS — F1210 Cannabis abuse, uncomplicated: Secondary | ICD-10-CM | POA: Diagnosis present

## 2014-08-08 DIAGNOSIS — D696 Thrombocytopenia, unspecified: Secondary | ICD-10-CM | POA: Diagnosis present

## 2014-08-08 DIAGNOSIS — K759 Inflammatory liver disease, unspecified: Secondary | ICD-10-CM

## 2014-08-08 DIAGNOSIS — F1990 Other psychoactive substance use, unspecified, uncomplicated: Secondary | ICD-10-CM

## 2014-08-08 DIAGNOSIS — F17210 Nicotine dependence, cigarettes, uncomplicated: Secondary | ICD-10-CM | POA: Diagnosis present

## 2014-08-08 DIAGNOSIS — K859 Acute pancreatitis without necrosis or infection, unspecified: Secondary | ICD-10-CM | POA: Diagnosis present

## 2014-08-08 DIAGNOSIS — Z720 Tobacco use: Secondary | ICD-10-CM

## 2014-08-08 DIAGNOSIS — B1710 Acute hepatitis C without hepatic coma: Principal | ICD-10-CM | POA: Diagnosis present

## 2014-08-08 DIAGNOSIS — F1911 Other psychoactive substance abuse, in remission: Secondary | ICD-10-CM | POA: Diagnosis present

## 2014-08-08 DIAGNOSIS — B179 Acute viral hepatitis, unspecified: Secondary | ICD-10-CM | POA: Diagnosis present

## 2014-08-08 DIAGNOSIS — E876 Hypokalemia: Secondary | ICD-10-CM | POA: Diagnosis present

## 2014-08-08 DIAGNOSIS — B1920 Unspecified viral hepatitis C without hepatic coma: Secondary | ICD-10-CM | POA: Diagnosis present

## 2014-08-08 DIAGNOSIS — R1010 Upper abdominal pain, unspecified: Secondary | ICD-10-CM

## 2014-08-08 HISTORY — DX: Other specified health status: Z78.9

## 2014-08-08 HISTORY — DX: Unspecified viral hepatitis C without hepatic coma: B19.20

## 2014-08-08 LAB — URINALYSIS, ROUTINE W REFLEX MICROSCOPIC
Glucose, UA: NEGATIVE mg/dL
Hgb urine dipstick: NEGATIVE
Ketones, ur: NEGATIVE mg/dL
LEUKOCYTES UA: NEGATIVE
NITRITE: NEGATIVE
Protein, ur: NEGATIVE mg/dL
Specific Gravity, Urine: 1.01 (ref 1.005–1.030)
Urobilinogen, UA: >8 mg/dL — ABNORMAL HIGH (ref 0.0–1.0)
pH: 7 (ref 5.0–8.0)

## 2014-08-08 LAB — PROTIME-INR
INR: 1.19 (ref 0.00–1.49)
PROTHROMBIN TIME: 15.2 s (ref 11.6–15.2)

## 2014-08-08 LAB — COMPREHENSIVE METABOLIC PANEL
ALBUMIN: 4.3 g/dL (ref 3.5–5.2)
ALK PHOS: 161 U/L — AB (ref 39–117)
ALT: 2089 U/L — AB (ref 0–53)
ANION GAP: 11 (ref 5–15)
AST: 1381 U/L — ABNORMAL HIGH (ref 0–37)
BUN: 8 mg/dL (ref 6–23)
CHLORIDE: 98 mmol/L (ref 96–112)
CO2: 27 mmol/L (ref 19–32)
Calcium: 8.8 mg/dL (ref 8.4–10.5)
Creatinine, Ser: 0.71 mg/dL (ref 0.50–1.35)
GFR calc non Af Amer: >90 mL/min (ref >90)
Glucose, Bld: 124 mg/dL — ABNORMAL HIGH (ref 70–99)
Potassium: 2.9 mmol/L — ABNORMAL LOW (ref 3.5–5.1)
Sodium: 136 mmol/L (ref 135–145)
TOTAL PROTEIN: 7.4 g/dL (ref 6.0–8.3)
Total Bilirubin: 2.4 mg/dL — ABNORMAL HIGH (ref 0.3–1.2)

## 2014-08-08 LAB — CBC WITH DIFFERENTIAL/PLATELET
BASOS ABS: 0.1 10*3/uL (ref 0.0–0.1)
BASOS PCT: 1 % (ref 0–1)
EOS ABS: 0 10*3/uL (ref 0.0–0.7)
EOS PCT: 1 % (ref 0–5)
HEMATOCRIT: 43.8 % (ref 39.0–52.0)
HEMOGLOBIN: 15.7 g/dL (ref 13.0–17.0)
LYMPHS PCT: 25 % (ref 12–46)
Lymphs Abs: 1.3 10*3/uL (ref 0.7–4.0)
MCH: 31.7 pg (ref 26.0–34.0)
MCHC: 35.8 g/dL (ref 30.0–36.0)
MCV: 88.5 fL (ref 78.0–100.0)
MONOS PCT: 12 % (ref 3–12)
Monocytes Absolute: 0.6 10*3/uL (ref 0.1–1.0)
Neutro Abs: 3.3 10*3/uL (ref 1.7–7.7)
Neutrophils Relative %: 61 % (ref 43–77)
PLATELETS: 138 10*3/uL — AB (ref 150–400)
RBC: 4.95 MIL/uL (ref 4.22–5.81)
RDW: 12.7 % (ref 11.5–15.5)
WBC: 5.3 10*3/uL (ref 4.0–10.5)

## 2014-08-08 LAB — ETHANOL: Alcohol, Ethyl (B): <5 mg/dL (ref 0–9)

## 2014-08-08 LAB — LIPASE, BLOOD: LIPASE: 116 U/L — AB (ref 11–59)

## 2014-08-08 LAB — ACETAMINOPHEN LEVEL: Acetaminophen (Tylenol), Serum: <10 ug/mL — ABNORMAL LOW (ref 10–30)

## 2014-08-08 MED ORDER — LORAZEPAM 1 MG PO TABS: 1.0000 mg | ORAL_TABLET | Freq: Four times a day (QID) | ORAL | Status: DC | PRN

## 2014-08-08 MED ORDER — ONDANSETRON HCL 4 MG/2ML IJ SOLN
4.0000 mg | Freq: Once | INTRAMUSCULAR | Status: AC
  Administered 2014-08-08: 4 mg via INTRAVENOUS
  Filled 2014-08-08: qty 2

## 2014-08-08 MED ORDER — SODIUM CHLORIDE 0.9 % IV SOLN
INTRAVENOUS | Status: DC
  Administered 2014-08-08 – 2014-08-09 (×2): via INTRAVENOUS

## 2014-08-08 MED ORDER — HYDROMORPHONE HCL 1 MG/ML IJ SOLN
0.5000 mg | Freq: Once | INTRAMUSCULAR | Status: AC
  Administered 2014-08-08: 0.5 mg via INTRAVENOUS
  Filled 2014-08-08: qty 1

## 2014-08-08 MED ORDER — ONDANSETRON HCL 4 MG/2ML IJ SOLN
4.0000 mg | Freq: Four times a day (QID) | INTRAMUSCULAR | Status: DC | PRN
  Administered 2014-08-09: 4 mg via INTRAVENOUS
  Filled 2014-08-08: qty 2

## 2014-08-08 MED ORDER — MORPHINE SULFATE 4 MG/ML IJ SOLN
4.0000 mg | INTRAMUSCULAR | Status: DC | PRN
  Administered 2014-08-08 (×2): 4 mg via INTRAVENOUS
  Filled 2014-08-08 (×2): qty 1

## 2014-08-08 MED ORDER — SODIUM CHLORIDE 0.9 % IV BOLUS (SEPSIS)
1000.0000 mL | Freq: Once | INTRAVENOUS | Status: AC
  Administered 2014-08-08: 1000 mL via INTRAVENOUS

## 2014-08-08 MED ORDER — THIAMINE HCL 100 MG/ML IJ SOLN
100.0000 mg | Freq: Every day | INTRAMUSCULAR | Status: DC
  Filled 2014-08-08: qty 2

## 2014-08-08 MED ORDER — HEPARIN SODIUM (PORCINE) 5000 UNIT/ML IJ SOLN
5000.0000 [IU] | Freq: Three times a day (TID) | INTRAMUSCULAR | Status: DC
  Administered 2014-08-08 – 2014-08-11 (×8): 5000 [IU] via SUBCUTANEOUS
  Filled 2014-08-08 (×6): qty 1

## 2014-08-08 MED ORDER — PNEUMOCOCCAL VAC POLYVALENT 25 MCG/0.5ML IJ INJ
0.5000 mL | INJECTION | INTRAMUSCULAR | Status: AC
  Administered 2014-08-09: 0.5 mL via INTRAMUSCULAR
  Filled 2014-08-08: qty 0.5

## 2014-08-08 MED ORDER — FOLIC ACID 1 MG PO TABS
1.0000 mg | ORAL_TABLET | Freq: Every day | ORAL | Status: DC
  Administered 2014-08-08 – 2014-08-11 (×4): 1 mg via ORAL
  Filled 2014-08-08 (×4): qty 1

## 2014-08-08 MED ORDER — SODIUM CHLORIDE 0.9 % IV SOLN
1000.0000 mL | Freq: Once | INTRAVENOUS | Status: AC
  Administered 2014-08-08: 1000 mL via INTRAVENOUS

## 2014-08-08 MED ORDER — IOHEXOL 300 MG/ML  SOLN
100.0000 mL | Freq: Once | INTRAMUSCULAR | Status: AC | PRN
  Administered 2014-08-08: 100 mL via INTRAVENOUS

## 2014-08-08 MED ORDER — MORPHINE SULFATE 4 MG/ML IJ SOLN
4.0000 mg | Freq: Once | INTRAMUSCULAR | Status: AC
  Administered 2014-08-08: 4 mg via INTRAVENOUS
  Filled 2014-08-08: qty 1

## 2014-08-08 MED ORDER — POTASSIUM CHLORIDE 10 MEQ/100ML IV SOLN
10.0000 meq | INTRAVENOUS | Status: AC
  Administered 2014-08-08 – 2014-08-09 (×6): 10 meq via INTRAVENOUS
  Filled 2014-08-08 (×2): qty 100

## 2014-08-08 MED ORDER — ONDANSETRON HCL 4 MG PO TABS: 4.0000 mg | ORAL_TABLET | Freq: Four times a day (QID) | ORAL | Status: DC | PRN

## 2014-08-08 MED ORDER — ADULT MULTIVITAMIN W/MINERALS CH
1.0000 | ORAL_TABLET | Freq: Every day | ORAL | Status: DC
  Administered 2014-08-08 – 2014-08-11 (×4): 1 via ORAL
  Filled 2014-08-08 (×4): qty 1

## 2014-08-08 MED ORDER — LORAZEPAM 2 MG/ML IJ SOLN: 1.0000 mg | Freq: Four times a day (QID) | INTRAMUSCULAR | Status: DC | PRN

## 2014-08-08 MED ORDER — INFLUENZA VAC SPLIT QUAD 0.5 ML IM SUSY
0.5000 mL | PREFILLED_SYRINGE | INTRAMUSCULAR | Status: AC
  Administered 2014-08-09: 0.5 mL via INTRAMUSCULAR
  Filled 2014-08-08: qty 0.5

## 2014-08-08 MED ORDER — NICOTINE 21 MG/24HR TD PT24
21.0000 mg | MEDICATED_PATCH | Freq: Every day | TRANSDERMAL | Status: DC
  Administered 2014-08-08 – 2014-08-11 (×4): 21 mg via TRANSDERMAL
  Filled 2014-08-08 (×4): qty 1

## 2014-08-08 MED ORDER — VITAMIN B-1 100 MG PO TABS
100.0000 mg | ORAL_TABLET | Freq: Every day | ORAL | Status: DC
  Administered 2014-08-08 – 2014-08-11 (×4): 100 mg via ORAL
  Filled 2014-08-08 (×4): qty 1

## 2014-08-08 MED ORDER — SODIUM CHLORIDE 0.9 % IJ SOLN
3.0000 mL | Freq: Two times a day (BID) | INTRAMUSCULAR | Status: DC
  Administered 2014-08-09 – 2014-08-11 (×4): 3 mL via INTRAVENOUS

## 2014-08-08 MED ORDER — POTASSIUM CHLORIDE 10 MEQ/100ML IV SOLN: 10.0000 meq | INTRAVENOUS | Status: DC

## 2014-08-08 MED ORDER — HYDROMORPHONE HCL 1 MG/ML IJ SOLN
1.0000 mg | INTRAMUSCULAR | Status: DC | PRN
  Administered 2014-08-09 (×2): 1 mg via INTRAVENOUS
  Filled 2014-08-08 (×2): qty 1

## 2014-08-08 NOTE — ED Notes (Signed)
Pt comes in with left side pain starting Saturday. Pt states since then he has had several episodes of nausea and vomiting. Pt states his urine looks darker than usual and it hurts to urinate. NAD noted at this time.

## 2014-08-08 NOTE — ED Provider Notes (Signed)
CSN: [id number]     Arrival date & time 08/08/14  1530 History  This chart was scribed for Ozella Rocks, MD by Tonye Royalty, ED Scribe. This patient was seen in room A323/A323-01 and the patient's care was started at 3:58 PM.    Chief Complaint  Patient presents with  . Flank Pain   The history is provided by the patient and a parent. No language interpreter was used.    HPI Comments: Ricky Fleming is a 29 y.o. male who presents to the Emergency Department complaining of "cramping" to left abdomen with onset 3 days ago. He reports associated nausea, vomiting, diarrhea, and fever. He states vomiting is worse when he has pain but still has nausea otherwise. He states is hungry but is unable to tolerate food or fluids. He states he uses some alcohol, last used 1 drink 2 days ago. Mother notes she has pancreatitis caused by gallstones. He also notes injury to his right hand; he states he fractured it last year but did not have money to see orthopedist, then struck it against a refrigerator 3 weeks ago and it continues to be painful and swollen.  Past Medical History  Diagnosis Date  . Medical history non-contributory    History reviewed. No pertinent past surgical history. Family History  Problem Relation Age of Onset  . Pancreatitis Mother     chronic   History  Substance Use Topics  . Smoking status: Current Every Day Smoker -- 2.00 packs/day    Types: Cigarettes  . Smokeless tobacco: Not on file  . Alcohol Use: 0.0 oz/week    0 Standard drinks or equivalent per week    Review of Systems  Constitutional: Positive for fever.  Gastrointestinal: Positive for nausea, vomiting, abdominal pain and diarrhea.  Musculoskeletal:       Right hand pain  All other systems reviewed and are negative.     Allergies  Review of patient's allergies indicates no known allergies.  Home Medications   Prior to Admission medications   Medication Sig Start Date End Date Taking? Authorizing  Provider  ibuprofen (ADVIL,MOTRIN) 800 MG tablet Take 1 tablet (800 mg total) by mouth 3 (three) times daily. Patient not taking: Reported on 08/08/2014 11/27/12   Janne Napoleon, NP   BP 106/53 mmHg  Pulse 65  Temp(Src) 98.6 F (37 C) (Oral)  Resp 14  Ht  (1.93 m)  Wt 177 lb 8 oz (80.513 kg)  BMI 21.61 kg/m2  SpO2 100% Physical Exam  Constitutional: He is oriented to person, place, and time. He appears well-developed and well-nourished.  HENT:  Head: Normocephalic and atraumatic.  Eyes: Conjunctivae are normal.  Neck: Normal range of motion. Neck supple.  Cardiovascular: Normal rate, regular rhythm and normal heart sounds.   No murmur heard. Pulmonary/Chest: Effort normal and breath sounds normal. No respiratory distress. He has no wheezes. He has no rales.  Abdominal: He exhibits no mass. There is tenderness in the left upper quadrant. There is no rebound, no guarding and no CVA tenderness.  Musculoskeletal: Normal range of motion.  deformity with tenderness over his right hand second and third MCP joint, Skin intact  Neurological: He is alert and oriented to person, place, and time.  Skin: Skin is warm and dry.  Psychiatric: He has a normal mood and affect.  Nursing note and vitals reviewed.   ED Course  Procedures (including critical care time)  DIAGNOSTIC STUDIES: Oxygen Saturation is 100% on room air, normal  by my interpretation.    COORDINATION OF CARE: 4:05 PM Discussed treatment plan with patient at beside, x-ray, blood work, nausea medication. The patient agrees with the plan and has no further questions at this time.   Labs Review Labs Reviewed  URINALYSIS, ROUTINE W REFLEX MICROSCOPIC - Abnormal; Notable for the following:    Bilirubin Urine SMALL (*)    Urobilinogen, UA >8.0 (*)    All other components within normal limits  CBC WITH DIFFERENTIAL/PLATELET - Abnormal; Notable for the following:    Platelets 138 (*)    All other components within normal  limits  COMPREHENSIVE METABOLIC PANEL - Abnormal; Notable for the following:    Potassium 2.9 (*)    Glucose, Bld 124 (*)    AST 1381 (*)    ALT 2089 (*)    Alkaline Phosphatase 161 (*)    Total Bilirubin 2.4 (*)    All other components within normal limits  LIPASE, BLOOD - Abnormal; Notable for the following:    Lipase 116 (*)    All other components within normal limits  ACETAMINOPHEN LEVEL - Abnormal; Notable for the following:    Acetaminophen (Tylenol), Serum <10.0 (*)    All other components within normal limits  ETHANOL  PROTIME-INR  CBC  COMPREHENSIVE METABOLIC PANEL  URINE RAPID DRUG SCREEN (HOSP PERFORMED)  HEPATITIS PANEL, ACUTE    Imaging Review Ct Abdomen Pelvis W Contrast  08/08/2014   CLINICAL DATA:  Left-sided abdominal pain with nausea and vomiting. Painful urination. Elevated lipase and liver function test.  EXAM: CT ABDOMEN AND PELVIS WITH CONTRAST  TECHNIQUE: Multidetector CT imaging of the abdomen and pelvis was performed using the standard protocol following bolus administration of intravenous contrast.  CONTRAST:  100mL OMNIPAQUE IOHEXOL 300 MG/ML  SOLN  COMPARISON:  None.  FINDINGS: There is slight hepatomegaly with slight periportal edema. No focal lesions. No dilated bile ducts. There is suggestion of some sludge in the gallbladder.  There are several minimally prominent periportal lymph nodes on images 28 and 29 of series 2.  Osseous structures are normal.  The spleen is normal. Pancreas is normal. Adrenal glands and kidneys are normal.  There are no dilated loops large or small bowel. Bladder and prostate gland are normal. No adenopathy  IMPRESSION: Hepatomegaly with periportal edema and slight prominence of periportal lymph nodes. The possibility of hepatitis should be considered.  The pancreas appears normal.   Electronically Signed   By: Francene BoyersJames  Maxwell M.D.   On: 08/08/2014 17:43   Dg Hand Complete Right  08/08/2014   CLINICAL DATA:  Right hand pain and  swelling since the patient struck a refrigerator 3 weeks ago.  EXAM: RIGHT HAND - COMPLETE 3+ VIEW  COMPARISON:  12/02/2012  FINDINGS: There is a nondisplaced healing fracture of the distal shaft of the second metacarpal. There is an old deformity of the head of the second metacarpal from prior fracture.  No other significant osseous abnormality.  IMPRESSION: Healing fracture of the distal shaft of the second metacarpal.   Electronically Signed   By: Francene BoyersJames  Maxwell M.D.   On: 08/08/2014 19:22     EKG Interpretation None      MDM   Final diagnoses:  Hepatitis  Pain of upper abdomen  Hypokalemia    Patient with flank pain. Worse on left side. Lipase mildly elevated but LFTs very elevated. CT scan done and showed hepatitis. Will admit to internal medicine.  I personally performed the services described in this documentation,  which was scribed in my presence. The recorded information has been reviewed and is accurate.    Benjiman Core, MD 08/09/14 [phone]

## 2014-08-08 NOTE — H&P (Addendum)
[facility] History and Physical  Ricky Fleming ZOX:[id number] DOB: April 26, 1986 DOA: 08/08/2014  Referring physician: Dr Rubin Payor - [facility] PCP: No PCP Per Patient   Chief Complaint: Abd pain  HPI: Ricky Fleming is a 29 y.o. male  Abd pain started 08/05/14 in the morning. 1 beer the night prior. LLQ. Initially achy in nature but then became stabbing in nature. Comes and goes. Has not tried anything to make it better. Worse w/ food. Diarrhea off and on for past year (very rarely w/ firm stool).   H/o drug abuse. Stopped 1 year ago after going to prison  Fractured right hand in July 2014. At that time patient was told to follow-up with orthopedic surgery for surgical repair. Patient did not do that at that time. Patient reports punching the refrigerator one day ago with worsening pain and swelling of the hand since that time. Decreased function with grip of the hand.  Review of Systems:  Constitutional:  No weight loss, night sweats, Fevers, chills, fatigue.  HEENT:  No headaches, Difficulty swallowing,Tooth/dental problems,Sore throat,  No sneezing, itching, ear ache, nasal congestion, post nasal drip,  Cardio-vascular:  No chest pain, Orthopnea, PND, swelling in lower extremities, anasarca, dizziness, palpitations  GI:  Per HPI Resp:   No shortness of breath with exertion or at rest. No excess mucus, no productive cough, No non-productive cough, No coughing up of blood.No change in color of mucus.No wheezing.No chest wall deformity  Skin:  no rash or lesions.  GU:  no dysuria, change in color of urine, no urgency or frequency. No flank pain.  Musculoskeletal:  Per HPI Psych:  No change in mood or affect. No depression or anxiety. No memory loss.   History reviewed. No pertinent past medical history. History reviewed. No pertinent past surgical history. Social History:  reports that he has been smoking Cigarettes.  He has been smoking about 2.00 packs per day. He does not  have any smokeless tobacco history on file. He reports that he drinks alcohol. He reports that he does not use illicit drugs.  No Known Allergies  Family History  Problem Relation Age of Onset  . Pancreatitis Mother     chronic     Prior to Admission medications   Medication Sig Start Date End Date Taking? Authorizing Provider  ibuprofen (ADVIL,MOTRIN) 800 MG tablet Take 1 tablet (800 mg total) by mouth 3 (three) times daily. Patient not taking: Reported on 08/08/2014 11/27/12   Janne Napoleon, NP   Physical Exam: Filed Vitals:   08/08/14 1630 08/08/14 1700 08/08/14 1800 08/08/14 1830  BP: 118/66 122/65 128/65 117/67  Pulse: 63 65 66 66  Temp:      TempSrc:      Resp: Height:      Weight:      SpO2: 97% 98% 99% 98%    Wt Readings from Last 3 Encounters:  08/08/14 86.183 kg (190 lb)  11/27/12 77.111 kg (170 lb)  05/01/12 81.647 kg (180 lb)    General:  Appears calm and comfortable Eyes:  PERRL, normal lids, irises & conjunctiva ENT:  grossly normal hearing, lips & tongue Neck:  no LAD, masses or thyromegaly Cardiovascular:  RRR, no m/r/g. No LE edema. Telemetry:  SR, no arrhythmias  Respiratory:  CTA bilaterally, no w/r/r. Normal respiratory effort. Abdomen:  soft, minimal tenderness to deep palpation of the left lower quadrant. Negative Murphy sign. Nontender at McBurney's point. Normal active bowel sounds Skin:  no rash or induration seen on limited exam Musculoskeletal:  Right hand with bony protrusion of the second MCP and swelling from the second to third MCP. Unable to close hand completely. Sensation and touch are intact Psychiatric:  grossly normal mood and affect, speech fluent and appropriate Neurologic:  grossly non-focal.          Labs on Admission:  Basic Metabolic Panel:  Recent Labs Lab 08/08/14 1613  NA 136  K 2.9*  CL 98  CO2 27  GLUCOSE 124*  BUN 8  CREATININE 0.71  CALCIUM 8.8   Liver Function Tests:  Recent Labs Lab  08/08/14 1613  AST 1381*  ALT 2089*  ALKPHOS 161*  BILITOT 2.4*  PROT 7.4  ALBUMIN 4.3    Recent Labs Lab 08/08/14 1613  LIPASE 116*   No results for input(s): AMMONIA in the last 168 hours. CBC:  Recent Labs Lab 08/08/14 1613  WBC 5.3  NEUTROABS 3.3  HGB 15.7  HCT 43.8  MCV 88.5  PLT 138*   Cardiac Enzymes: No results for input(s): CKTOTAL, CKMB, CKMBINDEX, TROPONINI in the last 168 hours.  BNP (last 3 results) No results for input(s): BNP in the last 8760 hours.  ProBNP (last 3 results) No results for input(s): PROBNP in the last 8760 hours.  CBG: No results for input(s): GLUCAP in the last 168 hours.  Radiological Exams on Admission: Ct Abdomen Pelvis W Contrast  08/08/2014   CLINICAL DATA:  Left-sided abdominal pain with nausea and vomiting. Painful urination. Elevated lipase and liver function test.  EXAM: CT ABDOMEN AND PELVIS WITH CONTRAST  TECHNIQUE: Multidetector CT imaging of the abdomen and pelvis was performed using the standard protocol following bolus administration of intravenous contrast.  CONTRAST:  OMNIPAQUE IOHEXOL 300 MG/ML  SOLN  COMPARISON:  None.  FINDINGS: There is slight hepatomegaly with slight periportal edema. No focal lesions. No dilated bile ducts. There is suggestion of some sludge in the gallbladder.  There are several minimally prominent periportal lymph nodes on images 28 and 29 of series 2.  Osseous structures are normal.  The spleen is normal. Pancreas is normal. Adrenal glands and kidneys are normal.  There are no dilated loops large or small bowel. Bladder and prostate gland are normal. No adenopathy  IMPRESSION: Hepatomegaly with periportal edema and slight prominence of periportal lymph nodes. The possibility of hepatitis should be considered.  The pancreas appears normal.   Electronically Signed   By: Francene Boyers M.D.   On: 08/08/2014 17:43     Assessment/Plan Principal Problem:   Hepatitis Active Problems:    Hypokalemia   Right hand fracture   Tobacco use   History of drug abuse   Pancreatitis   Hepatitis: AST 1381, ALT 2089, bilirubin 2.4, alkaline phosphatase 161, lipase 116. Etiology most likely consistent with acute viral hepatitis, but cannot rule out alcohol induced. CT without dilated biliary ducts and biliary sludge noted in gallbladder. Discussed case with Dr.Rehman who has agreed to see the patient in the morning. - Coags  - Acetaminophen level  - EtOH - Acute viral hepatitis panel  - Ultrasound  - Follow-up GI recommendations  - UDS - CIWA - NPO  Hypokalemia: 2.9 on admission.  - EKG - Tele - KCl 10 mEq x6  Right hand fracture: Patient never had hand repaired after fracture in July 2014(fracture distal second metacarpal ). Now likely with a second fracture in stable condition. - Hand x-ray - Consult orthopedics in the morning if  necessary   History of drug abuse: Patient states he has been clean of any drug use for more than a year. Quit after being sent to jail. - UDS  Tobacco use: Smokes 2 packs per day - nicotine patch  Code Status: FULL DVT Prophylaxis: Heparin Family Communication: none Disposition Plan: pending improvement  Ricky Fleming, Ricky Fleming Shela CommonsJ, MD Family Medicine [facility] [email or web address] Password [email or web address]

## 2014-08-09 ENCOUNTER — Inpatient Hospital Stay (HOSPITAL_COMMUNITY): Payer: Self-pay

## 2014-08-09 DIAGNOSIS — R001 Bradycardia, unspecified: Secondary | ICD-10-CM

## 2014-08-09 DIAGNOSIS — D696 Thrombocytopenia, unspecified: Secondary | ICD-10-CM | POA: Diagnosis present

## 2014-08-09 LAB — PROTIME-INR
INR: 1.21 (ref 0.00–1.49)
Prothrombin Time: 15.4 seconds — ABNORMAL HIGH (ref 11.6–15.2)

## 2014-08-09 LAB — COMPREHENSIVE METABOLIC PANEL
ALT: 2033 U/L — ABNORMAL HIGH (ref 0–53)
ANION GAP: 5 (ref 5–15)
AST: 1297 U/L — ABNORMAL HIGH (ref 0–37)
Albumin: 3.1 g/dL — ABNORMAL LOW (ref 3.5–5.2)
Alkaline Phosphatase: 133 U/L — ABNORMAL HIGH (ref 39–117)
BILIRUBIN TOTAL: 3.2 mg/dL — AB (ref 0.3–1.2)
BUN: <5 mg/dL — ABNORMAL LOW (ref 6–23)
CO2: 27 mmol/L (ref 19–32)
Calcium: 8.4 mg/dL (ref 8.4–10.5)
Chloride: 106 mmol/L (ref 96–112)
Creatinine, Ser: 0.59 mg/dL (ref 0.50–1.35)
GFR calc Af Amer: >90 mL/min (ref >90)
GFR calc non Af Amer: >90 mL/min (ref >90)
Glucose, Bld: 94 mg/dL (ref 70–99)
Potassium: 3.4 mmol/L — ABNORMAL LOW (ref 3.5–5.1)
Sodium: 138 mmol/L (ref 135–145)
TOTAL PROTEIN: 5.8 g/dL — AB (ref 6.0–8.3)

## 2014-08-09 LAB — RAPID URINE DRUG SCREEN, HOSP PERFORMED
Amphetamines: NOT DETECTED
Barbiturates: NOT DETECTED
Benzodiazepines: NOT DETECTED
Cocaine: NOT DETECTED
Opiates: POSITIVE — AB
Tetrahydrocannabinol: POSITIVE — AB

## 2014-08-09 LAB — TSH: TSH: 0.876 u[IU]/mL (ref 0.350–4.500)

## 2014-08-09 LAB — HEPATITIS PANEL, ACUTE
HCV Ab: REACTIVE — AB
HCV Ab: REACTIVE — AB
HEP A IGM: NONREACTIVE
HEP B C IGM: NONREACTIVE
HEP B S AG: NEGATIVE
HEP B S AG: NEGATIVE
Hep A IgM: NONREACTIVE
Hep B C IgM: NONREACTIVE

## 2014-08-09 LAB — CBC
HCT: 38.8 % — ABNORMAL LOW (ref 39.0–52.0)
Hemoglobin: 13.3 g/dL (ref 13.0–17.0)
MCH: 30.7 pg (ref 26.0–34.0)
MCHC: 34.3 g/dL (ref 30.0–36.0)
MCV: 89.6 fL (ref 78.0–100.0)
Platelets: 130 10*3/uL — ABNORMAL LOW (ref 150–400)
RBC: 4.33 MIL/uL (ref 4.22–5.81)
RDW: 13 % (ref 11.5–15.5)
WBC: 4.7 10*3/uL (ref 4.0–10.5)

## 2014-08-09 MED ORDER — POTASSIUM CHLORIDE IN NACL 20-0.9 MEQ/L-% IV SOLN
INTRAVENOUS | Status: DC
  Administered 2014-08-09 – 2014-08-10 (×2): via INTRAVENOUS

## 2014-08-09 MED ORDER — PANTOPRAZOLE SODIUM 40 MG IV SOLR
40.0000 mg | Freq: Two times a day (BID) | INTRAVENOUS | Status: DC
  Administered 2014-08-09 – 2014-08-11 (×4): 40 mg via INTRAVENOUS
  Filled 2014-08-09 (×4): qty 40

## 2014-08-09 MED ORDER — ONDANSETRON HCL 4 MG/2ML IJ SOLN
4.0000 mg | Freq: Four times a day (QID) | INTRAMUSCULAR | Status: DC
  Administered 2014-08-09 – 2014-08-11 (×8): 4 mg via INTRAVENOUS
  Filled 2014-08-09 (×8): qty 2

## 2014-08-09 MED ORDER — HYDROMORPHONE HCL 1 MG/ML IJ SOLN
1.0000 mg | INTRAMUSCULAR | Status: DC | PRN
  Administered 2014-08-09 – 2014-08-10 (×6): 1.5 mg via INTRAVENOUS
  Administered 2014-08-10 (×4): 1 mg via INTRAVENOUS
  Administered 2014-08-10: 1.5 mg via INTRAVENOUS
  Administered 2014-08-10 – 2014-08-11 (×4): 1 mg via INTRAVENOUS
  Filled 2014-08-09 (×2): qty 2
  Filled 2014-08-09: qty 1
  Filled 2014-08-09: qty 2
  Filled 2014-08-09: qty 1
  Filled 2014-08-09 (×4): qty 2
  Filled 2014-08-09: qty 1
  Filled 2014-08-09 (×3): qty 2
  Filled 2014-08-09 (×2): qty 1

## 2014-08-09 MED ORDER — PROMETHAZINE HCL 25 MG/ML IJ SOLN: 12.5000 mg | Freq: Four times a day (QID) | INTRAMUSCULAR | Status: DC | PRN

## 2014-08-09 NOTE — Progress Notes (Signed)
[facility] PROGRESS NOTE  Ricky Fleming ZOX:[id number] DOB: 19-Nov-1985 DOA: 08/08/2014 PCP: No PCP Per Patient    Code Status: Full code Family Communication: discuss with his girlfriend with permission. Disposition Plan: discharge when clinically appropriate.   Consultants:  Gastroenterology  Procedures:  None  Antibiotics:  None  HPI/Subjective: The patient is complaining of 8/10 epigastric abdominal pain. He has mild nausea but no vomiting. His last bowel movement was yesterday.  Objective: Filed Vitals:   08/09/14 0658  BP: 104/49  Pulse: 54  Temp:   Resp:   Temperature 97.7. Respiratory rate 16. Oxygen saturation 100% on room air.   Intake/Output Summary (Last 24 hours) at 08/09/14 1203 Last data filed at 08/09/14 1114  Gross per 24 hour  Intake 1698.83 ml  Output    450 ml  Net 1248.83 ml   Filed Weights   08/08/14 1541 08/08/14 1926  Weight: 86.183 kg (190 lb) 80.513 kg (177 lb 8 oz)    Exam:   General:  29 year old Caucasian man in no acute distress.  Cardiovascular: S1, S2, with now bradycardia.  Respiratory: clear to auscultation bilaterally.  Abdomen: positive bowel sounds, soft, mildly tender in the epigastrium and right upper quadrant; no appreciable hepatosplenomegaly; positive bowel sounds; no distention.  Musculoskeletal:  right hand with mild edema over the first and second metatarsal area; mild tenderness; a decrease in flexion of his fingers, to 3 and 4. Sensation grossly intact.  Neurologic: Alert and oriented 3.  Skin: Large tattoo on the right forearm.  Data Reviewed: Basic Metabolic Panel:  Recent Labs Lab 08/08/14 1613 08/09/14 0632  NA 136 138  K 2.9* 3.4*  CL 98 106  CO2 27 27  GLUCOSE 124* 94  BUN 8 <5*  CREATININE 0.71 0.59  CALCIUM 8.8 8.4   Liver Function Tests:  Recent Labs Lab 08/08/14 1613 08/09/14 0632  AST 1381* 1297*  ALT 2089* 2033*  ALKPHOS 161* 133*  BILITOT 2.4* 3.2*  PROT 7.4  5.8*  ALBUMIN 4.3 3.1*    Recent Labs Lab 08/08/14 1613  LIPASE 116*   No results for input(s): AMMONIA in the last 168 hours. CBC:  Recent Labs Lab 08/08/14 1613 08/09/14 0632  WBC 5.3 4.7  NEUTROABS 3.3  --   HGB 15.7 13.3  HCT 43.8 38.8*  MCV 88.5 89.6  PLT 138* 130*   Cardiac Enzymes: No results for input(s): CKTOTAL, CKMB, CKMBINDEX, TROPONINI in the last 168 hours. BNP (last 3 results) No results for input(s): BNP in the last 8760 hours.  ProBNP (last 3 results) No results for input(s): PROBNP in the last 8760 hours.  CBG: No results for input(s): GLUCAP in the last 168 hours.  No results found for this or any previous visit (from the past 240 hour(s)).   Studies: [place] Abdomen Complete  08/09/2014   CLINICAL DATA:  Hepatitis.  EXAM: ULTRASOUND ABDOMEN COMPLETE  COMPARISON:  CT scan dated 08/08/2014  FINDINGS: Gallbladder: No gallstones or wall thickening visualized. No sonographic Murphy sign noted.  Common bile duct: Diameter: 3.7 mm, normal.  Liver: No focal lesion identified. Within normal limits in parenchymal echogenicity.  IVC: No abnormality visualized.  Pancreas: Normal.  Spleen: Size and appearance within normal limits. 11.3 cm in length.  Right Kidney: Length: 13.3 cm. Echogenicity within normal limits. No mass or hydronephrosis visualized.  Left Kidney: Length: 11.1 cm. Echogenicity within normal limits. No mass or hydronephrosis visualized.  Abdominal aorta: No aneurysm visualized.  1.7 cm maximal diameter.  Other  findings: Several slightly prominent periportal lymph nodes are noted.  IMPRESSION: Left-sided periportal adenopathy, nonspecific. This can be seen with hepatitis.   Electronically Signed   By: Francene Boyers M.D.   On: 08/09/2014 08:04   Ct Abdomen Pelvis W Contrast  08/08/2014   CLINICAL DATA:  Left-sided abdominal pain with nausea and vomiting. Painful urination. Elevated lipase and liver function test.  EXAM: CT ABDOMEN AND PELVIS WITH CONTRAST   TECHNIQUE: Multidetector CT imaging of the abdomen and pelvis was performed using the standard protocol following bolus administration of intravenous contrast.  CONTRAST:  OMNIPAQUE IOHEXOL 300 MG/ML  SOLN  COMPARISON:  None.  FINDINGS: There is slight hepatomegaly with slight periportal edema. No focal lesions. No dilated bile ducts. There is suggestion of some sludge in the gallbladder.  There are several minimally prominent periportal lymph nodes on images 28 and 29 of series 2.  Osseous structures are normal.  The spleen is normal. Pancreas is normal. Adrenal glands and kidneys are normal.  There are no dilated loops large or small bowel. Bladder and prostate gland are normal. No adenopathy  IMPRESSION: Hepatomegaly with periportal edema and slight prominence of periportal lymph nodes. The possibility of hepatitis should be considered.  The pancreas appears normal.   Electronically Signed   By: Francene Boyers M.D.   On: 08/08/2014 17:43   Dg Hand Complete Right  08/08/2014   CLINICAL DATA:  Right hand pain and swelling since the patient struck a refrigerator 3 weeks ago.  EXAM: RIGHT HAND - COMPLETE 3+ VIEW  COMPARISON:  12/02/2012  FINDINGS: There is a nondisplaced healing fracture of the distal shaft of the second metacarpal. There is an old deformity of the head of the second metacarpal from prior fracture.  No other significant osseous abnormality.  IMPRESSION: Healing fracture of the distal shaft of the second metacarpal.   Electronically Signed   By: Francene Boyers M.D.   On: 08/08/2014 19:22    Scheduled Meds: . folic acid  1 mg Oral Daily  . heparin  5,000 Units Subcutaneous 3 times per day  . multivitamin with minerals  1 tablet Oral Daily  . nicotine  21 mg Transdermal Daily  . sodium chloride  3 mL Intravenous Q12H  . thiamine  100 mg Oral Daily   Or  . thiamine  100 mg Intravenous Daily   Continuous Infusions: . sodium chloride 125 mL/hr at 08/09/14 0659   Assessment and  plan:  Principal Problem:   Acute hepatitis Active Problems:   Hypokalemia   Right hand fracture   Tobacco abuse   History of drug abuse   Pancreatitis   Bradycardia   Thrombocytopenia    1. Acute hepatitis.  The patient's AST was 1381 and ALT was 2089 on admission. His total bilirubin was 2.4 on admission. Etiology unclear. However, given that the patient has a history of intravenous IV drug use and a tattoo, hepatitis C or B are possibilities. Will also consider acetaminophen effects given his use of oral Vicodin and APAP in the outpatient setting. -Ultrasound of his abdomen revealed left-sided periportal adenopathy-nonspecific but can be seen with hepatitis. -CT of his abdomen and pelvis revealed hepatomegaly with periportal edema and slight prominence of periportal lymph nodes. -Acetaminophen level was less than 10. GI has been consulted and will await Dr. Inge Rise recommendations. Viral hepatitis panel was ordered and is pending. We'll order HIV serology. We'll continue supportive treatment and analgesics as needed.  Elevated lipase-query acute pancreatitis versus  inflammation from hepatitis. CT and ultrasound of the abdomen revealed no obvious abdomen allergies of the pancreas. We'll continue to follow.  Polysubstance abuse. The patient admits a history of intravenous drug use with street opiates-namely oxycodone and hydrocodone. He denies any recent IV drug use in over 1-1/2 years. He also admits to oral street drug use with hydrocodone. He smokes more than 2 packs of cigarettes daily. He smokes marijuana on a regular basis. The patient was advised to avoid street drugs. He was advised to stop smoking. Nicotine patch offered but he declined it.  Hypokalemia. Etiology unclear. Potassium chloride supplement started and given. His potassium has improved. We'll continue supplementing it in the IV fluids and orally as tolerated. We'll order a magnesium level to rule out  deficiency.  Thrombocytopenia. Etiology may be secondary to acute infection/hepatitis. We'll order TSH and vitamin B12 level to rule out deficiencies. We'll continue to monitor.  Sinus bradycardia. The patient is asymptomatic. We'll order a TSH and free T4 for further evaluation.  Recent right hand fracture. The patient did not seek follow-up evaluation. X-ray reveals healing fracture of the distal shaft of the second metacarpal. We'll ask orthopedic surgery to assist with management; query needed cast or immobilizer.   Time spent: 40 minutes.    [facility]  [facility] Pager [phone]. If 7PM-7AM, please contact night-coverage at [email or web address], password [facility] 08/09/2014, 12:03 PM  LOS: 1 day

## 2014-08-09 NOTE — Progress Notes (Signed)
UR chart review completed.  

## 2014-08-09 NOTE — Care Management Note (Addendum)
    Page 1 of 1   08/11/2014     12:19:01 PM CARE MANAGEMENT NOTE 08/11/2014  Patient:  Ricky Fleming,Ricky Fleming   Account Number:  [id number]  Date Initiated:  08/09/2014  Documentation initiated by:  Sharrie RothmanBLACKWELL,Lucrezia Dehne C  Subjective/Objective Assessment:   Pt admitted from home with abd pain. Pt lives with his family and will return home at discharge. Pt is independent with ADL's.     Action/Plan:   Financial counselor is aware of self pay status and will contact pt for financial assistance. May need MATCh at discharge. [facility] and pt made aware of appt time.   Anticipated DC Date:  08/12/2014   Anticipated DC Plan:  HOME/SELF CARE  In-house referral  Financial Counselor      DC Planning Services  CM consult      Choice offered to / List presented to:             Status of service:  Completed, signed off Medicare Important Message given?   (If response is "NO", the following Medicare IM given date fields will be blank) Date Medicare IM given:   Medicare IM given by:   Date Additional Medicare IM given:   Additional Medicare IM given by:    Discharge Disposition:  HOME/SELF CARE  Per UR Regulation:    If discussed at Long Length of Stay Meetings, dates discussed:    Comments:  08/11/14 1215 Ricky Queenammy Wilma Wuthrich, RN BSN CM Pt discharged home today. Pt does not need MATCh voucher. No other CM needs noted.  08/09/14
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
## 2014-08-10 ENCOUNTER — Encounter (HOSPITAL_COMMUNITY): Payer: Self-pay | Admitting: Internal Medicine

## 2014-08-10 DIAGNOSIS — B1920 Unspecified viral hepatitis C without hepatic coma: Secondary | ICD-10-CM

## 2014-08-10 DIAGNOSIS — K7200 Acute and subacute hepatic failure without coma: Secondary | ICD-10-CM

## 2014-08-10 DIAGNOSIS — B1710 Acute hepatitis C without hepatic coma: Principal | ICD-10-CM

## 2014-08-10 DIAGNOSIS — R1010 Upper abdominal pain, unspecified: Secondary | ICD-10-CM | POA: Insufficient documentation

## 2014-08-10 HISTORY — DX: Unspecified viral hepatitis C without hepatic coma: B19.20

## 2014-08-10 LAB — HEPATIC FUNCTION PANEL
ALT: 1907 U/L — ABNORMAL HIGH (ref 0–53)
AST: 801 U/L — AB (ref 0–37)
Albumin: 3.1 g/dL — ABNORMAL LOW (ref 3.5–5.2)
Alkaline Phosphatase: 137 U/L — ABNORMAL HIGH (ref 39–117)
BILIRUBIN INDIRECT: 1.3 mg/dL — AB (ref 0.3–0.9)
Bilirubin, Direct: 2.2 mg/dL — ABNORMAL HIGH (ref 0.0–0.5)
Total Bilirubin: 3.5 mg/dL — ABNORMAL HIGH (ref 0.3–1.2)
Total Protein: 5.6 g/dL — ABNORMAL LOW (ref 6.0–8.3)

## 2014-08-10 LAB — CBC
HEMATOCRIT: 40 % (ref 39.0–52.0)
Hemoglobin: 13.9 g/dL (ref 13.0–17.0)
MCH: 31 pg (ref 26.0–34.0)
MCHC: 34.8 g/dL (ref 30.0–36.0)
MCV: 89.3 fL (ref 78.0–100.0)
PLATELETS: 144 10*3/uL — AB (ref 150–400)
RBC: 4.48 MIL/uL (ref 4.22–5.81)
RDW: 13.3 % (ref 11.5–15.5)
WBC: 5.6 10*3/uL (ref 4.0–10.5)

## 2014-08-10 LAB — BASIC METABOLIC PANEL
ANION GAP: 6 (ref 5–15)
CALCIUM: 8.4 mg/dL (ref 8.4–10.5)
CO2: 27 mmol/L (ref 19–32)
CREATININE: 0.58 mg/dL (ref 0.50–1.35)
Chloride: 106 mmol/L (ref 96–112)
GFR calc non Af Amer: >90 mL/min (ref >90)
Glucose, Bld: 90 mg/dL (ref 70–99)
POTASSIUM: 3.4 mmol/L — AB (ref 3.5–5.1)
Sodium: 139 mmol/L (ref 135–145)

## 2014-08-10 LAB — MAGNESIUM: MAGNESIUM: 1.6 mg/dL (ref 1.5–2.5)

## 2014-08-10 LAB — T4, FREE: FREE T4: 1.59 ng/dL (ref 0.80–1.80)

## 2014-08-10 LAB — VITAMIN B12: Vitamin B-12: >2000 pg/mL — ABNORMAL HIGH (ref 211–911)

## 2014-08-10 LAB — LIPASE, BLOOD: Lipase: 16 U/L (ref 11–59)

## 2014-08-10 MED ORDER — POTASSIUM CHLORIDE IN NACL 40-0.9 MEQ/L-% IV SOLN
INTRAVENOUS | Status: DC
  Administered 2014-08-10 – 2014-08-11 (×2): 75 mL/h via INTRAVENOUS

## 2014-08-10 NOTE — Progress Notes (Addendum)
Patient ID: Ricky Fleming, male   DOB: 1986-04-27, 29 y.o.   MRN: [id number] Feels  30% better. Patient informed of positive Hepatitis C results. Will need Hep C quaint and genotype. Ate tomato soup last night without any problems. Patient girlfriend tells me she was also doing IV drug with him and sharing needles. She was advised that she needed to be checked for Hepatitis C.  Blood pressure 108/50, pulse 75, temperature 98.1 F (36.7 C), temperature source Oral, resp. rate 18, height 6\' 4"  (1.93 m), weight 177 lb 8 oz (80.513 kg), SpO2 98 %. Assessment: Hepatitis C. Will get labs today and see in office in 2 weeks. Patient advised that we will need a clean drug screen before we treat.   GI attending note; Patient has acute hepatitis C. He tested negative in 2013 and 2014. Significant dropping transaminases since yesterday. Bilirubin remains elevated. Oral intake has improved. Patient educated about motive transmission of the disease and CDC guidelines regarding sexual activity. If he does not clear the widest spontaneously he will be treated but first he needs to change his behavior regarding drug abuse.
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
## 2014-08-11 LAB — CBC
HEMATOCRIT: 39.6 % (ref 39.0–52.0)
HEMOGLOBIN: 13.3 g/dL (ref 13.0–17.0)
MCH: 30.4 pg (ref 26.0–34.0)
MCHC: 33.6 g/dL (ref 30.0–36.0)
MCV: 90.4 fL (ref 78.0–100.0)
Platelets: 156 10*3/uL (ref 150–400)
RBC: 4.38 MIL/uL (ref 4.22–5.81)
RDW: 13.7 % (ref 11.5–15.5)
WBC: 6 10*3/uL (ref 4.0–10.5)

## 2014-08-11 LAB — HIV ANTIBODY (ROUTINE TESTING W REFLEX): HIV Screen 4th Generation wRfx: NONREACTIVE

## 2014-08-11 LAB — HEPATIC FUNCTION PANEL
ALBUMIN: 3.2 g/dL — AB (ref 3.5–5.2)
ALT: 1402 U/L — ABNORMAL HIGH (ref 0–53)
AST: 332 U/L — ABNORMAL HIGH (ref 0–37)
Alkaline Phosphatase: 149 U/L — ABNORMAL HIGH (ref 39–117)
BILIRUBIN TOTAL: 3 mg/dL — AB (ref 0.3–1.2)
Bilirubin, Direct: 1.8 mg/dL — ABNORMAL HIGH (ref 0.0–0.5)
Indirect Bilirubin: 1.2 mg/dL — ABNORMAL HIGH (ref 0.3–0.9)
Total Protein: 5.9 g/dL — ABNORMAL LOW (ref 6.0–8.3)

## 2014-08-11 LAB — BASIC METABOLIC PANEL
ANION GAP: 7 (ref 5–15)
CO2: 28 mmol/L (ref 19–32)
Calcium: 8.6 mg/dL (ref 8.4–10.5)
Chloride: 107 mmol/L (ref 96–112)
Creatinine, Ser: 0.68 mg/dL (ref 0.50–1.35)
GFR calc non Af Amer: >90 mL/min (ref >90)
Glucose, Bld: 118 mg/dL — ABNORMAL HIGH (ref 70–99)
POTASSIUM: 3.7 mmol/L (ref 3.5–5.1)
SODIUM: 142 mmol/L (ref 135–145)

## 2014-08-11 MED ORDER — OXYCODONE HCL 5 MG PO TABS: 5.0000 mg | ORAL_TABLET | Freq: Four times a day (QID) | ORAL | Status: DC | PRN

## 2014-08-11 MED ORDER — ADULT MULTIVITAMIN W/MINERALS CH: 1.0000 | ORAL_TABLET | Freq: Every day | ORAL | Status: DC

## 2014-08-11 MED ORDER — PROMETHAZINE HCL 12.5 MG PO TABS: 12.5000 mg | ORAL_TABLET | Freq: Four times a day (QID) | ORAL | Status: DC | PRN

## 2014-08-11 MED ORDER — RANITIDINE HCL 150 MG PO TABS: 150.0000 mg | ORAL_TABLET | Freq: Two times a day (BID) | ORAL | Status: DC

## 2014-08-11 NOTE — Consult Note (Signed)
Reason for Consult:fracture of the right index metacarpal Referring Physician: Hospitalist  Ricky Fleming is an 29 y.o. male.  HPI: He hit an object about three weeks ago and hurt his right hand.  He has had pain of the distal index metacarpal with some swelling but no redness.  He has history of prior fracture of the head of the second metacarpal years ago.  His pain has decreased in the hand but it is still tender.  He has no other injury of the hand.  Past Medical History  Diagnosis Date  . Medical history non-contributory   . Hepatitis C infection 08/10/2014    History reviewed. No pertinent past surgical history.  Family History  Problem Relation Age of Onset  . Pancreatitis Mother     chronic    Social History:  reports that he has been smoking Cigarettes.  He has been smoking about 2.00 packs per day. He does not have any smokeless tobacco history on file. He reports that he drinks alcohol. He reports that he does not use illicit drugs.  Allergies: No Known Allergies  Medications: I have reviewed the patient's current medications.  Results for orders placed or performed during the hospital encounter of 08/08/14 (from the past 48 hour(s))  Hepatitis panel, acute     Status: Abnormal   Collection Time: 08/09/14  9:38 AM  Result Value Ref Range   Hepatitis B Surface Ag NEGATIVE NEGATIVE   HCV Ab Reactive (A) NEGATIVE   Hep A IgM NON REACTIVE NON REACTIVE    Comment: (NOTE) Effective March 27, 2014, Hepatitis Acute Panel (test code [id number]) will be revised to automatically reflex to the Hepatitis C Viral RNA, Quantitative, Real-Time PCR assay if the Hepatitis C antibody screening result is Reactive. This action is being taken to ensure that the CDC/USPSTF recommended HCV diagnostic algorithm with the appropriate test reflex needed for accurate interpretation is followed.    Hep B C IgM NON REACTIVE NON REACTIVE    Comment: (NOTE) High levels of Hepatitis B Core IgM  antibody are detectable during the acute stage of Hepatitis B. This antibody is used to differentiate current from past HBV infection. Performed at [facility]   TSH     Status: None   Collection Time: 08/09/14  9:38 AM  Result Value Ref Range   TSH 0.876 0.350 - 4.500 uIU/mL  HIV antibody     Status: None   Collection Time: 08/09/14  6:50 PM  Result Value Ref Range   HIV Screen 4th Generation wRfx Non Reactive Non Reactive    Comment: (NOTE) Performed At: [facility] [place], [place] [id number] Lindon Romp MD MI:[id number]   Vitamin B12     Status: Abnormal   Collection Time: 08/09/14  6:50 PM  Result Value Ref Range   Vitamin B-12 >2000 (H) 211 - 911 pg/mL    Comment: Performed at [facility]  T4, free     Status: None   Collection Time: 08/09/14  6:50 PM  Result Value Ref Range   Free T4 1.59 0.80 - 1.80 ng/dL    Comment: Performed at [place]     Status: Abnormal   Collection Time: 08/09/14  6:50 PM  Result Value Ref Range   Prothrombin Time 15.4 (H) 11.6 - 15.2 seconds   INR 1.21 0.00 - 1.49  CBC     Status: Abnormal   Collection Time: 08/10/14  5:57 AM  Result  Value Ref Range   WBC 5.6 4.0 - 10.5 K/uL   RBC 4.48 4.22 - 5.81 MIL/uL   Hemoglobin 13.9 13.0 - 17.0 g/dL   HCT 40.0 39.0 - 52.0 %   MCV 89.3 78.0 - 100.0 fL   MCH 31.0 26.0 - 34.0 pg   MCHC 34.8 30.0 - 36.0 g/dL   RDW 13.3 11.5 - 15.5 %   Platelets 144 (L) 150 - 400 K/uL  Hepatic function panel     Status: Abnormal   Collection Time: 08/10/14  5:57 AM  Result Value Ref Range   Total Protein 5.6 (L) 6.0 - 8.3 g/dL   Albumin 3.1 (L) 3.5 - 5.2 g/dL   AST 801 (H) 0 - 37 U/L   ALT 1907 (H) 0 - 53 U/L   Alkaline Phosphatase 137 (H) 39 - 117 U/L   Total Bilirubin 3.5 (H) 0.3 - 1.2 mg/dL   Bilirubin, Direct 2.2 (H) 0.0 - 0.5 mg/dL   Indirect Bilirubin 1.3 (H) 0.3 - 0.9 mg/dL  Basic metabolic panel     Status: Abnormal   Collection  Time: 08/10/14  5:57 AM  Result Value Ref Range   Sodium 139 135 - 145 mmol/L   Potassium 3.4 (L) 3.5 - 5.1 mmol/L   Chloride 106 96 - 112 mmol/L   CO2 27 19 - 32 mmol/L   Glucose, Bld 90 70 - 99 mg/dL   BUN <5 (L) 6 - 23 mg/dL   Creatinine, Ser 0.58 0.50 - 1.35 mg/dL   Calcium 8.4 8.4 - 10.5 mg/dL   GFR calc non Af Amer >90 >90 mL/min   GFR calc Af Amer >90 >90 mL/min    Comment: (NOTE) The eGFR has been calculated using the CKD EPI equation. This calculation has not been validated in all clinical situations. eGFR's persistently <90 mL/min signify possible Chronic Kidney Disease.    Anion gap 6 5 - 15  Magnesium     Status: None   Collection Time: 08/10/14  5:57 AM  Result Value Ref Range   Magnesium 1.6 1.5 - 2.5 mg/dL  Lipase, blood     Status: None   Collection Time: 08/10/14  5:57 AM  Result Value Ref Range   Lipase 16 11 - 59 U/L    US Abdomen Complete  08/09/2014   CLINICAL DATA:  Hepatitis.  EXAM: ULTRASOUND ABDOMEN COMPLETE  COMPARISON:  CT scan dated 08/08/2014  FINDINGS: Gallbladder: No gallstones or wall thickening visualized. No sonographic Murphy sign noted.  Common bile duct: Diameter: 3.7 mm, normal.  Liver: No focal lesion identified. Within normal limits in parenchymal echogenicity.  IVC: No abnormality visualized.  Pancreas: Normal.  Spleen: Size and appearance within normal limits. 11.3 cm in length.  Right Kidney: Length: 13.3 cm. Echogenicity within normal limits. No mass or hydronephrosis visualized.  Left Kidney: Length: 11.1 cm. Echogenicity within normal limits. No mass or hydronephrosis visualized.  Abdominal aorta: No aneurysm visualized.  1.7 cm maximal diameter.  Other findings: Several slightly prominent periportal lymph nodes are noted.  IMPRESSION: Left-sided periportal adenopathy, nonspecific. This can be seen with hepatitis.   Electronically Signed   By: Lorriane Shire M.D.   On: 08/09/2014 08:04    Review of Systems  Gastrointestinal:        History of Hepatitis C, upper abdominal pain, acute hepatitis, pancreatitis.   Musculoskeletal: Positive for joint pain (Pain right index finger for about three weeks after hitting an object.  No redness.).   Blood pressure  108/48, pulse 54, temperature 97.5 F (36.4 C), temperature source Oral, resp. rate 18, height 6' 4"  (1.93 m), weight 80.513 kg (177 lb 8 oz), SpO2 99 %. Physical Exam  Constitutional: He is oriented to person, place, and time. He appears well-developed and well-nourished.  HENT:  Head: Normocephalic and atraumatic.  Eyes: Conjunctivae and EOM are normal. Pupils are equal, round, and reactive to light.  Neck: Normal range of motion.  Cardiovascular: Normal rate and regular rhythm.   Respiratory: Effort normal.  Musculoskeletal: He exhibits tenderness (Pain of the right index finger distal third with some swelling, no redness, no rotary changes.  ROM of the index finger is full as are the rest of the fingers.  NV intact.).  Neurological: He is alert and oriented to person, place, and time. He has normal reflexes.  Skin: Skin is warm and dry.  Psychiatric: He has a normal mood and affect. His behavior is normal. Judgment and thought content normal.    Assessment/Plan: He has fracture healing of the second metacarpal on the right nondisplaced.  He has good callus present.  He has no rotary changes and very good motion of the index finger.  He does have some slight swelling over the callus area but the fracture is stable.  I do not feel at this time he needs a cast or splint as long as he avoid hitting the hand or trying to carry heavy objects.  I have told him this.  I can see him in the office after discharge if he would like.  It will take about two to three more weeks for full healing.  Ravinder Hofland 08/11/2014, 7:14 AM

## 2014-08-11 NOTE — Discharge Summary (Signed)
Physician Discharge Summary  Ricky Fleming:[id number] DOB: 1985/05/26 DOA: 08/08/2014  PCP: No PCP Per Patient  Admit date: 08/08/2014 Discharge date: 08/11/2014  Time spent: Greater than 30 minutes  Recommendations for Outpatient Follow-up:  1. The patient was instructed to follow-up with gastroenterologist, Dr. Karilyn Cota for further management  2. Recommend rechecking the patient's liver transaminases and total bilirubin.   Discharge Diagnoses:  1. Acute hepatitis C infection. 2. Elevated liver transaminases secondary to #1; trending downward. 3. 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 4. Epigastric abdominal pain secondary to acute hepatitis C infection. 5. Thrombocytopenia, thought to be secondary to acute hepatitis C. Resolved with supportive treatment. 6. Hypokalemia. Supplemented and repleted. 7. Elevated lipase, not thought to be clinically acute pancreatitis, but associated with acute hepatitis C infection. 8. Mild asymptomatic bradycardia. Thyroid function was within normal limits. 9. Healing fracture of the distal shaft of the second metatarsal. 10. Tobacco/marijuana abuse. He was advised to stop smoking both.  Discharge Condition: Improved.  Diet recommendation: Regular as tolerated.  Filed Weights   08/08/14 1541 08/08/14 1926  Weight: 86.183 kg (190 lb) 80.513 kg (177 lb 8 oz)    History of present illness:  The patient is a 29 year old man with a history of intravenous drug use and tobacco use. He presented to the emergency department on 08/08/14 with a chief complaint of epigastric and right upper quadrant abdominal pain. He also complained of chronic right hand pain. He endorsed a fracture of his right hand in July 2014 but never followed up with an orthopedic surgeon for repair. In the ED, he was afebrile and hemodynamically stable. His lab data were significant for a lipase of 116, AST of 1381, ALT of 2089, total bilirubin of 2.4,  and platelet count of 138. His urine drug screen was positive for opiate and THC. CT of the abdomen and pelvis revealed hepatomegaly with periportal edema and slight prominence of the periportal lymph nodes-possibly from hepatitis. He was admitted for further evaluation and management.  Hospital Course:   1. Acute hepatitis, likely secondary to hepatitis C infection.  The patient's AST was 1381 and ALT was 2089 on admission. His total bilirubin was 2.4 on admission. CT of his abdomen and pelvis revealed hepatomegaly with periportal edema and slight prominence of periportal lymph nodes. His acetaminophen level was less than 10. He has a history of intravenous IV drug use and a tattoo, so hepatitis viral serology was ordered. Gastroenterologist, Dr. Karilyn Cota was consulted. He presumed that the patient had an acute viral hepatitis infection. Therefore no additional laboratory studies were ordered until the results were confirmed. He did order an ultrasound of the abdomen for further evaluation. Ultrasound of his abdomen revealed left-sided periportal adenopathy-nonspecific, but can be seen with hepatitis. The hepatitis panel revealed a positive hepatitis C antibody. Dr. Karilyn Cota ordered a viral load; results were pending at the time of discharge. HIV was subsequently ordered and was nonreactive. His liver transaminases trended down progressively, but they were still quite elevated. His total bilirubin also trended downward. He will follow-up with Dr. Karilyn Cota for evaluation and consideration for pharmacological treatment. Of note, the patient's girlfriend was informed of the hepatitis C infection. She was encouraged to get tested.  Abdominal pain, nausea, and vomiting, likely secondary to acute hepatitis. The patient was started on IV Protonix and scheduled IV Zofran. IV Dilaudid was ordered for pain. He was started on a clear liquid diet which drank little of  initially. His pain, nausea, and vomiting subsided  substantially. His diet was advanced to full liquids which he tolerated. He was discharged on ranitidine, as needed Phenergan, and as needed oxycodone with no refills.  Polysubstance abuse. The patient admited a history of intravenous drug use with street opiates-namely oxycodone and hydrocodone. He denies any recent IV drug use in over 1-1/2 years. He also admits to oral street drug use with hydrocodone. He smokes more than 2 packs of cigarettes daily. He smokes marijuana on a regular basis. The patient was advised to avoid street drugs. He was advised to stop smoking. Nicotine patch was offered but he declined it.  Hypokalemia. Potassium chloride supplement was started and given. His potassium has improved. His magnesium level was within normal limits.  Thrombocytopenia. His low platelet count on admission was likely secondary to acute infection/hepatitis. His TSH was within normal limits. Vitamin B12 level was greater than 2000. His platelet count normalized at time of discharge.  Sinus bradycardia. The patient was asymptomatic with a heart rate in the 50s. His TSH was within normal limits.  Recent right hand fracture. The patient did not seek follow-up evaluation when the fracture initially occurred in 2014. X-ray revealed healing fracture of the distal shaft of the second metacarpal. Dr. Hilda Lias was consulted. Per his assessment, the patient did not need a cast or splint. He recommended that the patient avoid hitting the hand or trying to carry heavy objects. Otherwise he can follow-up with the patient as needed.    Procedures:  None  Consultations:  Gastroenterologist, Dr. Karilyn Cota  Orthopedic surgeon, Dr. Hilda Lias  Discharge Exam: Filed Vitals:   08/11/14 0550  BP: 108/48  Pulse: 54  Temp: 97.5 F (36.4 C)  Resp: 18     General: 29 year old Caucasian man in no acute distress.  Cardiovascular: S1, S2, with no murmurs rubs or gallops.  Respiratory: clear to  auscultation bilaterally.  Abdomen: positive bowel sounds, soft, minimal/decreasing tenderness in the epigastrium and right upper quadrant; no appreciable hepatosplenomegaly; positive bowel sounds; no distention.  Musculoskeletal: right hand with mild edema over the first and second metatarsal area; mild tenderness; a decrease in flexion of his fingers, to 3 and 4. Sensation grossly intact.  Neurologic: Alert and oriented 3.  Skin: Large tattoo on the right forearm.  Discharge Instructions   Discharge Instructions    Diet general    Complete by:  As directed      Discharge instructions    Complete by:  As directed   Take medications as prescribed. Avoid over-the-counter pain medications. Avoid taking street drugs. Try to stop smoking.     Increase activity slowly    Complete by:  As directed           Discharge Medication List as of 08/11/2014  1:16 PM    START taking these medications   Details  Multiple Vitamin (MULTIVITAMIN WITH MINERALS) TABS tablet Take 1 tablet by mouth daily., Starting 08/11/2014, Until Discontinued, No Print    oxyCODONE (ROXICODONE) 5 MG immediate release tablet Take 1 tablet (5 mg total) by mouth every 6 (six) hours as needed for severe pain., Starting 08/11/2014, Until Discontinued, Print    promethazine (PHENERGAN) 12.5 MG tablet Take 1 tablet (12.5 mg total) by mouth every 6 (six) hours as needed for nausea or vomiting., Starting 08/11/2014, Until Discontinued, Print    ranitidine (ZANTAC) 150 MG tablet Take 1 tablet (150 mg total) by mouth 2 (two) times daily., Starting 08/11/2014, Until Discontinued, Print  STOP taking these medications     ibuprofen (ADVIL,MOTRIN) 800 MG tablet        No Known Allergies Follow-up Information    [facility]   Specialty:  Occupational Therapy   Why:  at 1:00   Contact information:   [address] [place] [address] [phone]       Follow  up with REHMAN,NAJEEB U, MD. Schedule an appointment as soon as possible for a visit in 4 weeks.   Specialty:  Gastroenterology   Why:  Gastroenterologist specialist   Contact information:   [address], [address] [place] [address] [phone]       Follow up with Darreld McleanKEELING,WAYNE, MD.   Specialty:  Orthopedic Surgery   Why:  Orthopedic surgeon. Follow-up if needed   Contact information:   [address] [place] [address] [phone]        The results of significant diagnostics from this hospitalization (including imaging, microbiology, ancillary and laboratory) are listed below for reference.    Significant Diagnostic Studies: [place] Abdomen Complete  08/09/2014   CLINICAL DATA:  Hepatitis.  EXAM: ULTRASOUND ABDOMEN COMPLETE  COMPARISON:  CT scan dated 08/08/2014  FINDINGS: Gallbladder: No gallstones or wall thickening visualized. No sonographic Murphy sign noted.  Common bile duct: Diameter: 3.7 mm, normal.  Liver: No focal lesion identified. Within normal limits in parenchymal echogenicity.  IVC: No abnormality visualized.  Pancreas: Normal.  Spleen: Size and appearance within normal limits. 11.3 cm in length.  Right Kidney: Length: 13.3 cm. Echogenicity within normal limits. No mass or hydronephrosis visualized.  Left Kidney: Length: 11.1 cm. Echogenicity within normal limits. No mass or hydronephrosis visualized.  Abdominal aorta: No aneurysm visualized.  1.7 cm maximal diameter.  Other findings: Several slightly prominent periportal lymph nodes are noted.  IMPRESSION: Left-sided periportal adenopathy, nonspecific. This can be seen with hepatitis.   Electronically Signed   By: Francene BoyersJames  Maxwell M.D.   On: 08/09/2014 08:04   Ct Abdomen Pelvis W Contrast  08/08/2014   CLINICAL DATA:  Left-sided abdominal pain with nausea and vomiting. Painful urination. Elevated lipase and liver function test.  EXAM: CT ABDOMEN AND PELVIS WITH CONTRAST  TECHNIQUE: Multidetector CT imaging of the  abdomen and pelvis was performed using the standard protocol following bolus administration of intravenous contrast.  CONTRAST:  100mL OMNIPAQUE IOHEXOL 300 MG/ML  SOLN  COMPARISON:  None.  FINDINGS: There is slight hepatomegaly with slight periportal edema. No focal lesions. No dilated bile ducts. There is suggestion of some sludge in the gallbladder.  There are several minimally prominent periportal lymph nodes on images 28 and 29 of series 2.  Osseous structures are normal.  The spleen is normal. Pancreas is normal. Adrenal glands and kidneys are normal.  There are no dilated loops large or small bowel. Bladder and prostate gland are normal. No adenopathy  IMPRESSION: Hepatomegaly with periportal edema and slight prominence of periportal lymph nodes. The possibility of hepatitis should be considered.  The pancreas appears normal.   Electronically Signed   By: Francene BoyersJames  Maxwell M.D.   On: 08/08/2014 17:43   Dg Hand Complete Right  08/08/2014   CLINICAL DATA:  Right hand pain and swelling since the patient struck a refrigerator 3 weeks ago.  EXAM: RIGHT HAND - COMPLETE 3+ VIEW  COMPARISON:  12/02/2012  FINDINGS: There is a nondisplaced healing fracture of the distal shaft of the second metacarpal. There is  an old deformity of the head of the second metacarpal from prior fracture.  No other significant osseous abnormality.  IMPRESSION: Healing fracture of the distal shaft of the second metacarpal.   Electronically Signed   By: Francene Boyers M.D.   On: 08/08/2014 19:22    Microbiology: No results found for this or any previous visit (from the past 240 hour(s)).   Labs: Basic Metabolic Panel:  Recent Labs Lab 08/08/14 1613 08/09/14 0632 08/10/14 0557 08/11/14 0717  NA 136 138 139 142  K 2.9* 3.4* 3.4* 3.7  CL 98 106 106 107  CO2 GLUCOSE 124* 94 90 118*  BUN 8 <5* <5* <5*  CREATININE 0.71 0.59 0.58 0.68  CALCIUM 8.8 8.4 8.4 8.6  MG  --   --  1.6  --    Liver Function  Tests:  Recent Labs Lab 08/08/14 1613 08/09/14 0632 08/10/14 0557 08/11/14 0717  AST 1381* 1297* 801* 332*  ALT 2089* 2033* 1907* 1402*  ALKPHOS 161* 133* 137* 149*  BILITOT 2.4* 3.2* 3.5* 3.0*  PROT 7.4 5.8* 5.6* 5.9*  ALBUMIN 4.3 3.1* 3.1* 3.2*    Recent Labs Lab 08/08/14 1613 08/10/14 0557  LIPASE 116* 16   No results for input(s): AMMONIA in the last 168 hours. CBC:  Recent Labs Lab 08/08/14 1613 08/09/14 0632 08/10/14 0557 08/11/14 0717  WBC 5.3 4.7 5.6 6.0  NEUTROABS 3.3  --   --   --   HGB 15.7 13.3 13.9 13.3  HCT 43.8 38.8* 40.0 39.6  MCV 88.5 89.6 89.3 90.4  PLT 138* 130* 144* 156   Cardiac Enzymes: No results for input(s): CKTOTAL, CKMB, CKMBINDEX, TROPONINI in the last 168 hours. BNP: BNP (last 3 results) No results for input(s): BNP in the last 8760 hours.  ProBNP (last 3 results) No results for input(s): PROBNP in the last 8760 hours.  CBG: No results for input(s): GLUCAP in the last 168 hours.     Signed:  Yuritza Paulhus  [facility] 08/11/2014, 6:26 PM

## 2014-08-11 NOTE — Progress Notes (Signed)
IV catheter removed and intact. IV site clean dry and intact. Discharge instructions and medications reviewed and discussed with patient. All follow up appointments were reviewed and discussed with patient. All questions were answered. No further questions at this time. Pt escorted by nurse tech.

## 2014-08-14 LAB — HCV RNA QUANT
HCV Quantitative Log: 5.29 {Log} — ABNORMAL HIGH (ref <1.18)
HCV Quantitative Log: 4.83 {Log} — ABNORMAL HIGH (ref <1.18)
HCV Quantitative: 194340 IU/mL — ABNORMAL HIGH (ref <15)
HCV Quantitative: 67075 IU/mL — ABNORMAL HIGH (ref <15)

## 2014-08-14 LAB — HEPATITIS C GENOTYPE: HCV GENOTYPE: 3

## 2014-08-14 LAB — HEPATITIS C VRS RNA DETECT BY PCR-QUAL: Hepatitis C Vrs RNA by PCR-Qual: POSITIVE — AB

## 2014-12-20 ENCOUNTER — Emergency Department (HOSPITAL_COMMUNITY): Payer: Self-pay

## 2014-12-20 ENCOUNTER — Encounter (HOSPITAL_COMMUNITY): Payer: Self-pay | Admitting: *Deleted

## 2014-12-20 ENCOUNTER — Emergency Department (HOSPITAL_COMMUNITY)
Admission: EM | Admit: 2014-12-20 | Discharge: 2014-12-20 | Disposition: A | Discharge status: Home / Self Care | Payer: Self-pay | Attending: Emergency Medicine | Admitting: Emergency Medicine

## 2014-12-20 DIAGNOSIS — Y998 Other external cause status: Secondary | ICD-10-CM | POA: Insufficient documentation

## 2014-12-20 DIAGNOSIS — W06XXXA Fall from bed, initial encounter: Secondary | ICD-10-CM | POA: Insufficient documentation

## 2014-12-20 DIAGNOSIS — S3992XA Unspecified injury of lower back, initial encounter: Secondary | ICD-10-CM | POA: Insufficient documentation

## 2014-12-20 DIAGNOSIS — Z8619 Personal history of other infectious and parasitic diseases: Secondary | ICD-10-CM | POA: Insufficient documentation

## 2014-12-20 DIAGNOSIS — Z720 Tobacco use: Secondary | ICD-10-CM | POA: Insufficient documentation

## 2014-12-20 DIAGNOSIS — Y9389 Activity, other specified: Secondary | ICD-10-CM | POA: Insufficient documentation

## 2014-12-20 DIAGNOSIS — M545 Low back pain: Secondary | ICD-10-CM

## 2014-12-20 DIAGNOSIS — Y9289 Other specified places as the place of occurrence of the external cause: Secondary | ICD-10-CM | POA: Insufficient documentation

## 2014-12-20 MED ORDER — KETOROLAC TROMETHAMINE 60 MG/2ML IM SOLN
60.0000 mg | Freq: Once | INTRAMUSCULAR | Status: AC
  Administered 2014-12-20: 60 mg via INTRAMUSCULAR
  Filled 2014-12-20: qty 2

## 2014-12-20 MED ORDER — CYCLOBENZAPRINE HCL 10 MG PO TABS: 10.0000 mg | ORAL_TABLET | Freq: Three times a day (TID) | ORAL | Status: AC | PRN

## 2014-12-20 MED ORDER — IBUPROFEN 600 MG PO TABS: 600.0000 mg | ORAL_TABLET | Freq: Three times a day (TID) | ORAL | Status: AC | PRN

## 2014-12-20 NOTE — ED Provider Notes (Signed)
CSN: [id number]     Arrival date & time 12/20/14  2033 History   First MD Initiated Contact with Patient 12/20/14 2104     Chief Complaint  Patient presents with  . Back Pain      HPI Patient reports low back pain after injury today.  He states he was sliding off of his bed when his wife and his child fell onto his back causing him to hyperextend at the level is lower lumbar spine.  He states discomfort and pain in his back at this time.  He denies weakness of his arms or legs.  Denies abdominal pain.  No other complaints.  His pain is moderate in severity.   Past Medical History  Diagnosis Date  . Medical history non-contributory   . Hepatitis C infection 08/10/2014   History reviewed. No pertinent past surgical history. Family History  Problem Relation Age of Onset  . Pancreatitis Mother     chronic   Social History  Substance Use Topics  . Smoking status: Current Every Day Smoker -- 1.00 packs/day    Types: Cigarettes  . Smokeless tobacco: None  . Alcohol Use: 0.0 oz/week    0 Standard drinks or equivalent per week    Review of Systems  All other systems reviewed and are negative.     Allergies  Review of patient's allergies indicates no known allergies.  Home Medications   Prior to Admission medications   Medication Sig Start Date End Date Taking? Authorizing Provider  Multiple Vitamin (MULTIVITAMIN WITH MINERALS) TABS tablet Take 1 tablet by mouth daily. Patient not taking: Reported on 12/20/2014 08/11/14   Elliot Cousin, MD  oxyCODONE (ROXICODONE) 5 MG immediate release tablet Take 1 tablet (5 mg total) by mouth every 6 (six) hours as needed for severe pain. Patient not taking: Reported on 12/20/2014 08/11/14   Elliot Cousin, MD  promethazine (PHENERGAN) 12.5 MG tablet Take 1 tablet (12.5 mg total) by mouth every 6 (six) hours as needed for nausea or vomiting. Patient not taking: Reported on 12/20/2014 08/11/14   Elliot Cousin, MD  ranitidine (ZANTAC) 150 MG tablet  Take 1 tablet (150 mg total) by mouth 2 (two) times daily. Patient not taking: Reported on 12/20/2014 08/11/14   Elliot Cousin, MD   BP 112/56 mmHg  Pulse 65  Temp(Src) 97.7 F (36.5 C) (Oral)  Resp 20  Ht  (1.93 m)  Wt 175 lb (79.379 kg)  BMI 21.31 kg/m2  SpO2 100% Physical Exam  Constitutional: He is oriented to person, place, and time. He appears well-developed and well-nourished.  HENT:  Head: Normocephalic and atraumatic.  Eyes: EOM are normal.  Neck: Normal range of motion.  Cardiovascular: Normal rate, regular rhythm, normal heart sounds and intact distal pulses.   Pulmonary/Chest: Effort normal and breath sounds normal. No respiratory distress.  Abdominal: Soft. He exhibits no distension. There is no tenderness.  Musculoskeletal: Normal range of motion.  Mild lumbar and paralumbar tenderness without lumbar step-off.  No thoracic tenderness.  Normal lower extremity strength in major muscle groups bilaterally.  Neurological: He is alert and oriented to person, place, and time.  Skin: Skin is warm and dry.  Psychiatric: He has a normal mood and affect. Judgment normal.  Nursing note and vitals reviewed.   ED Course  Procedures (including critical care time) Labs Review Labs Reviewed - No data to display  Imaging Review No results found.   EKG Interpretation None      MDM   Final  diagnoses:  None    Likely strain.  Plain films to be obtained.  Anti-inflammatories administered.  10:09 PM Patient has some improvement in his discomfort and pain at this time.  I'm awaiting plain films to be read by radiology however my review at this time demonstrates no obvious subluxation or fracture.  Patient will remain in the emergency department until his plain films are read by radiology.  I suspect they will be normal and the patient will be discharged home with anti-inflammatories and muscle relaxants a primary care follow-up.    Azalia Bilis, MD 12/20/14 [phone]

## 2014-12-20 NOTE — ED Notes (Addendum)
Pt reporting pain in lower back after injury.  Pt reports he was playing with his kid and he fell from the bed and the child fell onto him.

## 2016-05-09 IMAGING — DX DG LUMBAR SPINE COMPLETE 4+V
5 series · 5 of 5 positions shown · non-contrast
Comparison: CT 08/08/2014

CLINICAL DATA: Low back pain.  Minor trauma.  Initial encounter.

EXAM:
LUMBAR SPINE - COMPLETE 4+ VIEW

[l-spine ap]
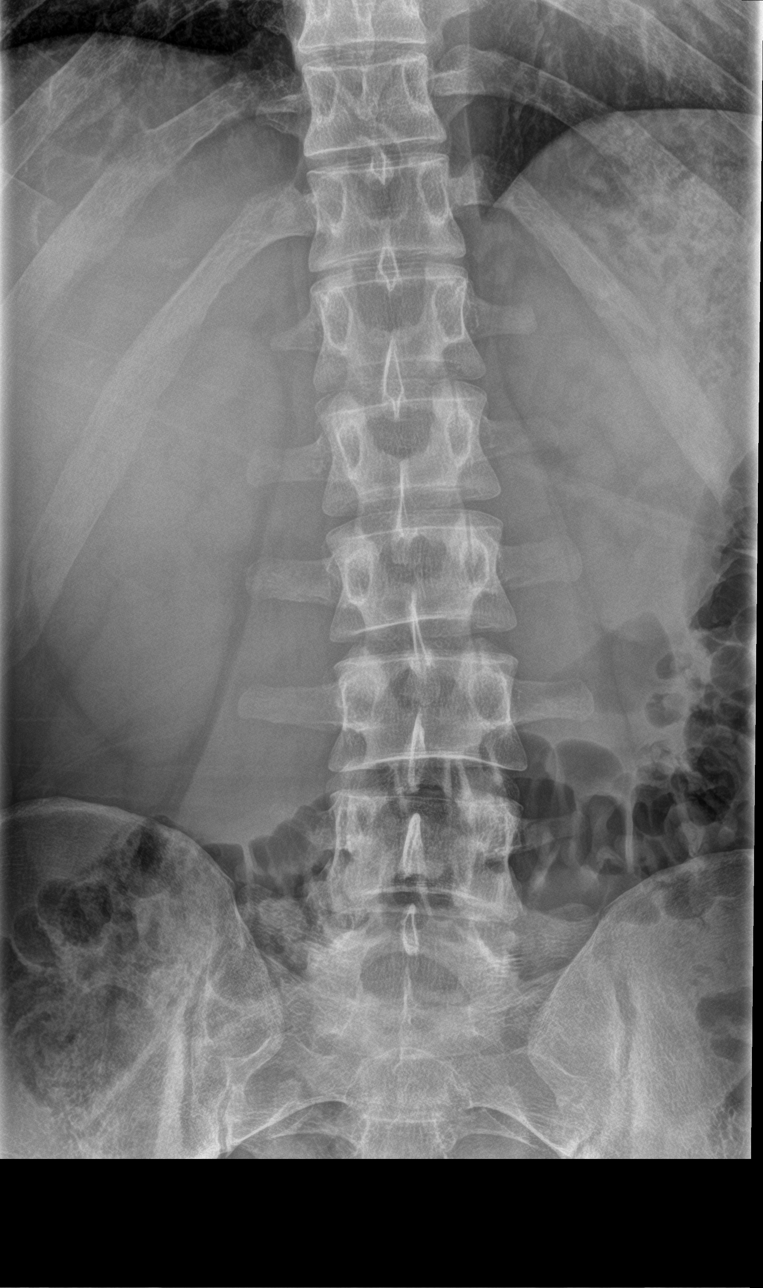

[l-spine obl (1 of 2)]
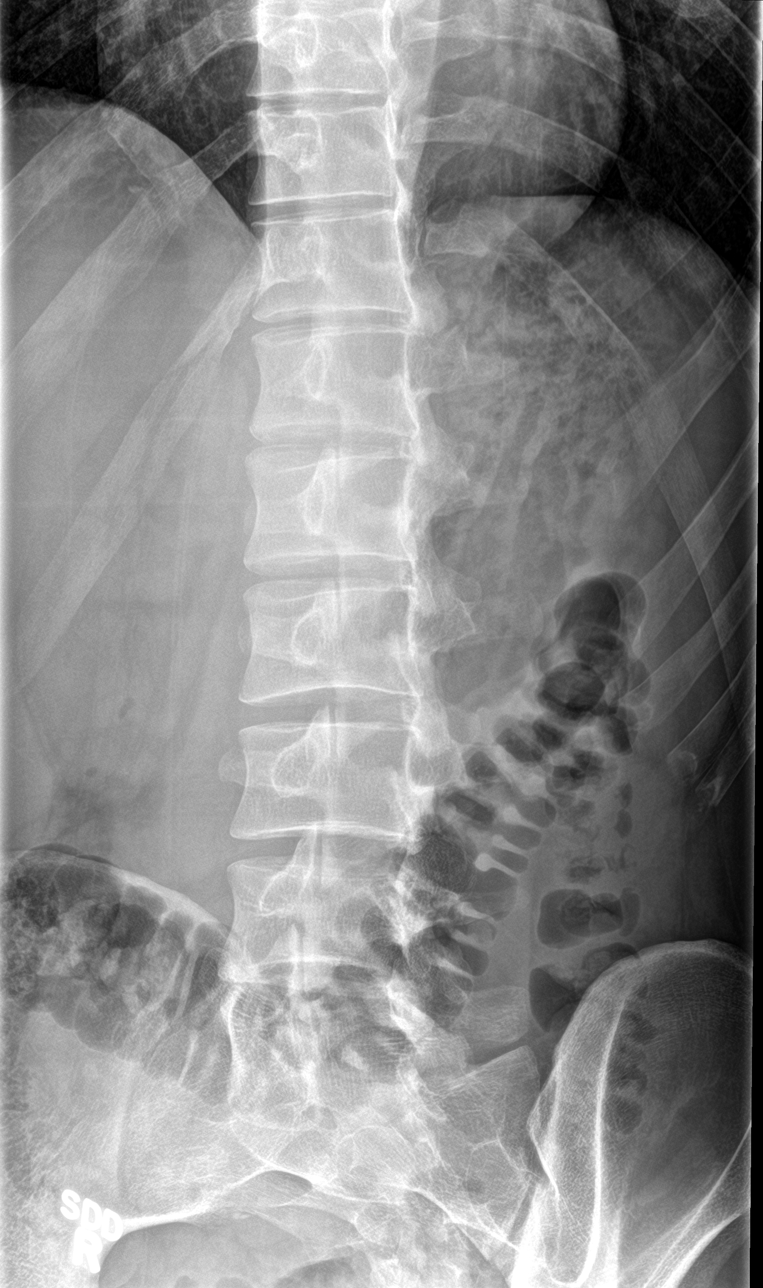

[l-spine obl (2 of 2)]
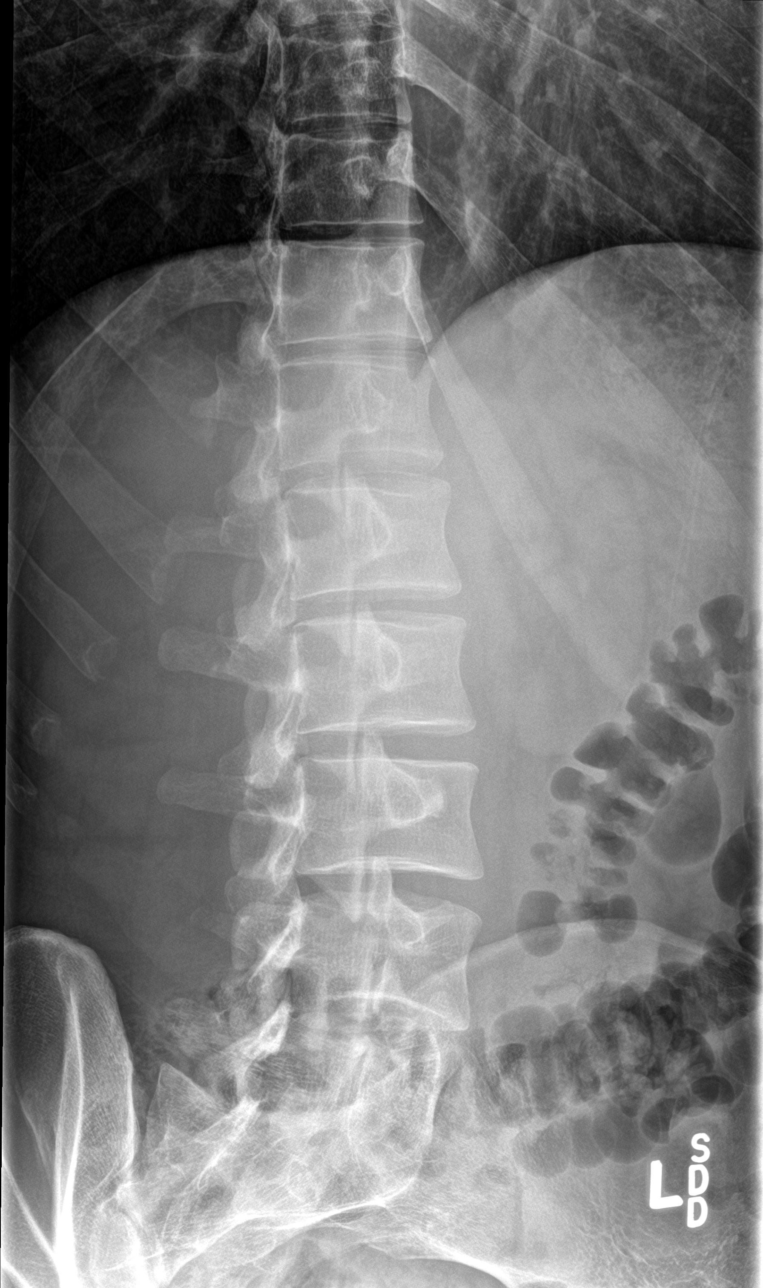

[l-spine lat]
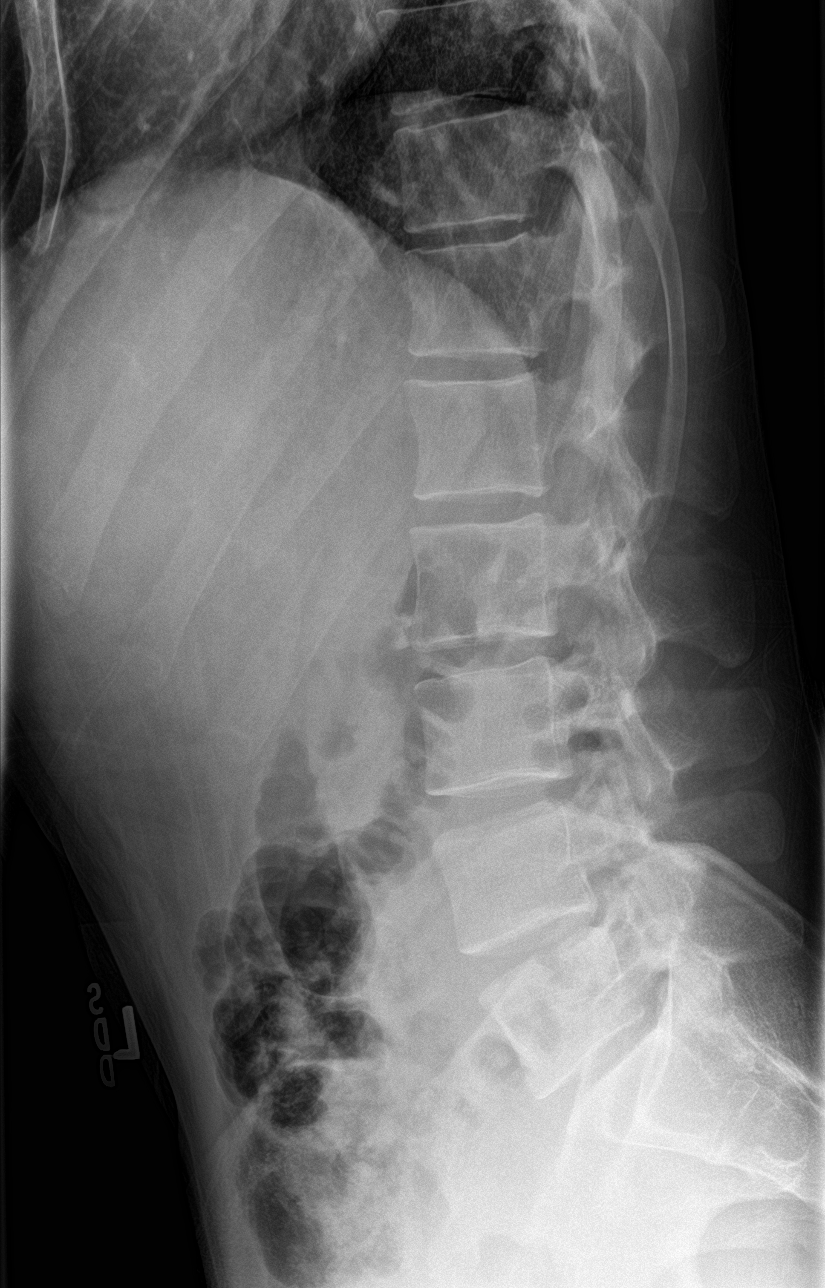

[l-spine spot]
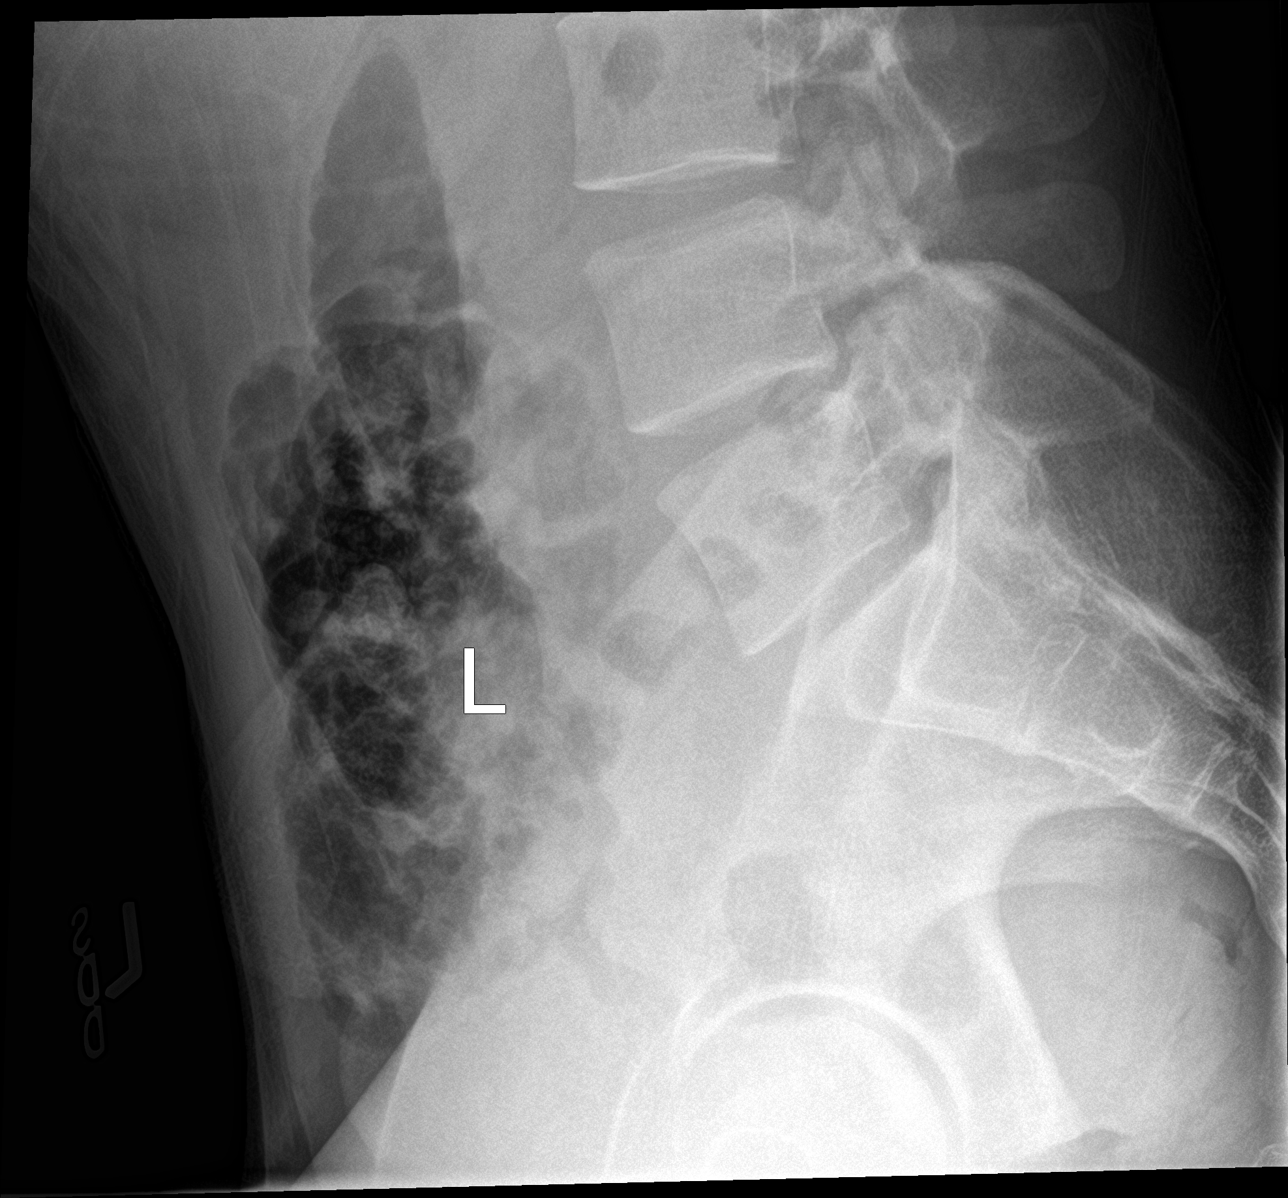

[5 of 5 positions shown; findings below may reference images not displayed]

FINDINGS: Six non rib-bearing lumbar type vertebral bodies. Minimal convex
left lumbar spine curvature. Sacroiliac joints are symmetric.
Maintenance of vertebral body height and alignment. Intervertebral
disc heights are maintained.
IMPRESSION: No acute osseous abnormality.
